# Patient Record
Sex: Female | Born: 1935 | Race: White | Hispanic: No | Marital: Single | Smoking: Never smoker
Health system: Southern US, Community
[De-identification: ages and names within clinical notes are randomized; demographics above are authoritative.]

## PROBLEM LIST (undated history)

## (undated) DIAGNOSIS — I10 Essential (primary) hypertension: Secondary | ICD-10-CM

## (undated) DIAGNOSIS — R569 Unspecified convulsions: Secondary | ICD-10-CM

## (undated) HISTORY — PX: NO PAST SURGERIES: SHX2092

---

## 2005-01-09 ENCOUNTER — Ambulatory Visit: Payer: Self-pay | Admitting: Internal Medicine

## 2005-01-30 ENCOUNTER — Ambulatory Visit: Payer: Self-pay | Admitting: Internal Medicine

## 2005-05-23 ENCOUNTER — Emergency Department: Payer: Self-pay | Admitting: Emergency Medicine

## 2005-05-23 ENCOUNTER — Other Ambulatory Visit: Payer: Self-pay

## 2005-07-24 ENCOUNTER — Encounter: Payer: Self-pay | Admitting: Internal Medicine

## 2005-07-31 ENCOUNTER — Encounter: Payer: Self-pay | Admitting: Internal Medicine

## 2005-09-09 ENCOUNTER — Emergency Department: Payer: Self-pay | Admitting: Emergency Medicine

## 2006-07-19 ENCOUNTER — Ambulatory Visit: Payer: Self-pay | Admitting: Internal Medicine

## 2007-02-15 ENCOUNTER — Emergency Department: Payer: Self-pay | Admitting: Internal Medicine

## 2007-04-29 ENCOUNTER — Ambulatory Visit: Payer: Self-pay | Admitting: Unknown Physician Specialty

## 2008-05-26 ENCOUNTER — Ambulatory Visit: Payer: Self-pay | Admitting: Internal Medicine

## 2008-10-11 ENCOUNTER — Inpatient Hospital Stay: Payer: Self-pay | Admitting: Internal Medicine

## 2010-04-12 ENCOUNTER — Emergency Department: Payer: Self-pay | Admitting: Emergency Medicine

## 2010-11-26 ENCOUNTER — Observation Stay: Payer: Self-pay | Admitting: Internal Medicine

## 2011-01-10 ENCOUNTER — Ambulatory Visit: Payer: Self-pay | Admitting: Neurology

## 2011-01-11 ENCOUNTER — Emergency Department: Payer: Self-pay

## 2011-01-29 ENCOUNTER — Inpatient Hospital Stay: Payer: Self-pay | Admitting: Family Medicine

## 2011-03-21 ENCOUNTER — Ambulatory Visit: Payer: Self-pay | Admitting: Internal Medicine

## 2011-04-22 ENCOUNTER — Emergency Department: Payer: Self-pay | Admitting: Emergency Medicine

## 2012-09-05 ENCOUNTER — Ambulatory Visit: Payer: Self-pay | Admitting: Internal Medicine

## 2016-02-27 ENCOUNTER — Other Ambulatory Visit: Payer: Self-pay | Admitting: Physician Assistant

## 2016-02-27 ENCOUNTER — Ambulatory Visit
Admission: RE | Admit: 2016-02-27 | Discharge: 2016-02-27 | Disposition: A | Discharge status: Home / Self Care | Payer: Medicare Other | Source: Ambulatory Visit | Attending: Physician Assistant | Admitting: Physician Assistant

## 2016-02-27 DIAGNOSIS — M7122 Synovial cyst of popliteal space [Baker], left knee: Secondary | ICD-10-CM | POA: Insufficient documentation

## 2016-02-27 DIAGNOSIS — M7989 Other specified soft tissue disorders: Secondary | ICD-10-CM | POA: Diagnosis present

## 2016-02-27 DIAGNOSIS — M25562 Pain in left knee: Secondary | ICD-10-CM | POA: Insufficient documentation

## 2016-11-19 ENCOUNTER — Emergency Department: Payer: Medicare Other

## 2016-11-19 ENCOUNTER — Emergency Department
Admission: EM | Admit: 2016-11-19 | Discharge: 2016-11-19 | Disposition: A | Discharge status: Home / Self Care | Payer: Medicare Other | Attending: Emergency Medicine | Admitting: Emergency Medicine

## 2016-11-19 DIAGNOSIS — M25552 Pain in left hip: Secondary | ICD-10-CM | POA: Diagnosis not present

## 2016-11-19 DIAGNOSIS — W19XXXA Unspecified fall, initial encounter: Secondary | ICD-10-CM

## 2016-11-19 DIAGNOSIS — I10 Essential (primary) hypertension: Secondary | ICD-10-CM | POA: Insufficient documentation

## 2016-11-19 DIAGNOSIS — Y92009 Unspecified place in unspecified non-institutional (private) residence as the place of occurrence of the external cause: Secondary | ICD-10-CM | POA: Insufficient documentation

## 2016-11-19 DIAGNOSIS — M25512 Pain in left shoulder: Secondary | ICD-10-CM | POA: Diagnosis not present

## 2016-11-19 DIAGNOSIS — Y9301 Activity, walking, marching and hiking: Secondary | ICD-10-CM | POA: Diagnosis not present

## 2016-11-19 DIAGNOSIS — W1809XA Striking against other object with subsequent fall, initial encounter: Secondary | ICD-10-CM | POA: Insufficient documentation

## 2016-11-19 DIAGNOSIS — M25551 Pain in right hip: Secondary | ICD-10-CM | POA: Insufficient documentation

## 2016-11-19 DIAGNOSIS — S79912A Unspecified injury of left hip, initial encounter: Secondary | ICD-10-CM | POA: Diagnosis present

## 2016-11-19 DIAGNOSIS — Y999 Unspecified external cause status: Secondary | ICD-10-CM | POA: Diagnosis not present

## 2016-11-19 HISTORY — DX: Unspecified convulsions: R56.9

## 2016-11-19 HISTORY — DX: Essential (primary) hypertension: I10

## 2016-11-19 NOTE — ED Notes (Signed)
Dr. Pershing ProudSchaevitz informed of BP, pt takes multiple medicines for BP [facility] Dr. Pershing ProudSchaevitz informed pt to take BP medications when she gets home.

## 2016-11-19 NOTE — ED Provider Notes (Signed)
[facility] Emergency Department Provider Note  ____________________________________________   First MD Initiated Contact with Patient 11/19/16 1438     (approximate)  I have reviewed the triage vital signs and the nursing notes.   HISTORY  Chief Complaint Fall   HPI Gina Hampton is a 80 y.o. female with a history of hypertension as well as seizures who is presenting to the emergency department after mechanical fall. She says that she was trying to walk fast with her walker when the walker got ahead of her and she fell forward. She says that she stretched out her left arm and may have hit her left hip as well. She is having pain about the left shoulder which she says is now gone away. However, at home she said it lasted for quite a long time and that this is why she came into the emergency department. She denies any headache. Denies hitting her head or losing consciousness.  Prior to my interview her she isn't to ambulate in the emergency department with minimal assistance. Past Medical History:  Diagnosis Date  . Hypertension   . Seizures (HCC)     There are no active problems to display for this patient.   History reviewed. No pertinent surgical history.  Prior to Admission medications   Not on File    Allergies Patient has no known allergies.  History reviewed. No pertinent family history.  Social History Social History  Substance Use Topics  . Smoking status: Not on file  . Smokeless tobacco: Not on file  . Alcohol use Not on file    Review of Systems Constitutional: No fever/chills Eyes: No visual changes. ENT: No sore throat. Cardiovascular: Denies chest pain. Respiratory: Denies shortness of breath. Gastrointestinal: No abdominal pain.  No nausea, no vomiting.  No diarrhea.  No constipation. Genitourinary: Negative for dysuria. Musculoskeletal: Negative for back pain. Skin: Negative for rash. Neurological: Negative for  headaches, focal weakness or numbness.  10-point ROS otherwise negative.  ____________________________________________   PHYSICAL EXAM:  VITAL SIGNS: ED Triage Vitals [11/19/16 1416]  Enc Vitals Group     BP (!) 159/82     Pulse Rate 60     Resp 16     Temp 97.8 F (36.6 C)     Temp Source Oral     SpO2 98 %     Weight      Height      Head Circumference      Peak Flow      Pain Score 0     Pain Loc      Pain Edu?      Excl. in GC?     Constitutional: Alert and oriented. Well appearing and in no acute distress. Eyes: Conjunctivae are normal. PERRL. EOMI. Head: Atraumatic. Nose: No congestion/rhinnorhea. Mouth/Throat: Mucous membranes are moist.   Neck: No stridor.   Cardiovascular: Normal rate, regular rhythm. Grossly normal heart sounds.   Respiratory: Normal respiratory effort.  No retractions. Lungs CTAB. Gastrointestinal: Soft and nontender. No distention.  Musculoskeletal: No lower extremity tenderness nor edema.  No joint effusions.Pelvis is stable. No tenderness to the bilateral hips. 5 out of 5 strength to bilateral lower extremities. No tenderness or deformity over the left shoulder and she is able to range the left shoulder to full range of motion actively. Neurovascularly intact. Neurologic:  Normal speech and language. No gross focal neurologic deficits are appreciated. Skin:  Skin is warm, dry and intact. No rash noted. Psychiatric:  Mood and affect are normal. Speech and behavior are normal.  ____________________________________________   LABS (all labs ordered are listed, but only abnormal results are displayed)  Labs Reviewed - No data to display ____________________________________________  EKG   ____________________________________________  RADIOLOGY    DG Shoulder Left (Final result)  Result time 11/19/16 16:46:03  Final result by Charlett NoseKevin Dover, MD (11/19/16 16:46:03)           Narrative:   CLINICAL DATA: Fall, left shoulder and hip  pain.  EXAM: LEFT SHOULDER - 2+ VIEW  COMPARISON: None.  FINDINGS: Mild degenerative changes with joint space narrowing and early spurring within the glenohumeral joint. No acute bony abnormality. Specifically, no fracture, subluxation, or dislocation. Soft tissues are intact.  IMPRESSION: No acute bony abnormality.   Electronically Signed By: Charlett NoseKevin Dover M.D. On: 11/19/2016 16:46            DG Hip Unilat W or Wo Pelvis 2-3 Views Left (Final result)  Result time 11/19/16 16:46:55  Final result by David A SwazilandJordan, MD (11/19/16 16:46:55)           Narrative:   CLINICAL DATA: Status post fall with left hip pain.  EXAM: DG HIP (WITH OR WITHOUT PELVIS) 2-3V LEFT  COMPARISON: None in PACs  FINDINGS: The bones are subjectively osteopenic. The pelvis exhibits no lytic or blastic lesion nor acute fracture. AP and lateral views of the left hip reveal mild narrowing of the joint space. The articular surface of the femoral head and acetabulum remains smoothly rounded. The femoral neck, intertrochanteric, and subtrochanteric regions of the left hip appear normal.  There are numerous pelvic phleboliths. There are pelvic arterial calcifications.  IMPRESSION: Mild degenerative change of the left hip. No acute fracture nor dislocation.   Electronically Signed By: David SwazilandJordan M.D. On: 11/19/2016 16:46          ____________________________________________   PROCEDURES  Procedure(s) performed:   Procedures  Critical Care performed:   ____________________________________________   INITIAL IMPRESSION / ASSESSMENT AND PLAN / ED COURSE  Pertinent labs & imaging results that were available during my care of the patient were reviewed by me and considered in my medical decision making (see chart for details).    Clinical Course   ----------------------------------------- 5:24 PM on  11/19/2016 -----------------------------------------  Patient resting ultimately this time. I updated her as to her very reassuring x-ray results. She'll be discharged home. Mechanical fall without any obvious serious internal injury.  ____________________________________________   FINAL CLINICAL IMPRESSION(S) / ED DIAGNOSES  Medical screening exam. Fall.    NEW MEDICATIONS STARTED DURING THIS VISIT:  New Prescriptions   No medications on file     Note:  This document was prepared using Dragon voice recognition software and may include unintentional dictation errors.    Myrna Blazeravid Matthew Schaevitz, MD 11/19/16 1725

## 2016-11-19 NOTE — ED Triage Notes (Signed)
Pt presents [facility] from [facility] after a near fall. PT states she was walking and her walker got too far from her, like it was rolling away from her. Pt states L arm became caught in walker, told EMS L armpit pain and L knee pain. Pt denies pain upon arrival. Pt has hx of falls recently. Denies blurred vision, denies hitting head, back/neck pain, Denies feeling dizzy. Pt had cortisone shot recently in L knee and EMS states she has been falling more often. Pt states she did not fully fall to ground, caught herself on walker. Alert, oriented, moving all extremities, answering all questions.

## 2016-11-19 NOTE — ED Notes (Signed)
Pt ambulated to restroom and back with assist, denies any pain after moving.

## 2016-11-19 NOTE — ED Notes (Signed)
E-signature pad not working. Pt verbalized DC

## 2016-11-19 NOTE — ED Notes (Signed)
Pt ambulated to restroom and back to stretcher. Gina SkeansLou Ann, pt relations gave pt something to drink and snack on.

## 2017-01-01 ENCOUNTER — Emergency Department
Admission: EM | Admit: 2017-01-01 | Discharge: 2017-01-01 | Disposition: A | Discharge status: Home / Self Care | Payer: Medicare Other | Attending: Emergency Medicine | Admitting: Emergency Medicine

## 2017-01-01 ENCOUNTER — Encounter: Payer: Self-pay | Admitting: Emergency Medicine

## 2017-01-01 ENCOUNTER — Emergency Department: Payer: Medicare Other

## 2017-01-01 DIAGNOSIS — Y999 Unspecified external cause status: Secondary | ICD-10-CM | POA: Insufficient documentation

## 2017-01-01 DIAGNOSIS — W19XXXA Unspecified fall, initial encounter: Secondary | ICD-10-CM | POA: Insufficient documentation

## 2017-01-01 DIAGNOSIS — I10 Essential (primary) hypertension: Secondary | ICD-10-CM | POA: Diagnosis not present

## 2017-01-01 DIAGNOSIS — I6782 Cerebral ischemia: Secondary | ICD-10-CM | POA: Insufficient documentation

## 2017-01-01 DIAGNOSIS — Y92009 Unspecified place in unspecified non-institutional (private) residence as the place of occurrence of the external cause: Secondary | ICD-10-CM | POA: Insufficient documentation

## 2017-01-01 DIAGNOSIS — Z043 Encounter for examination and observation following other accident: Secondary | ICD-10-CM | POA: Insufficient documentation

## 2017-01-01 DIAGNOSIS — Y939 Activity, unspecified: Secondary | ICD-10-CM | POA: Diagnosis not present

## 2017-01-01 LAB — CBC WITH DIFFERENTIAL/PLATELET
Basophils Absolute: 0 10*3/uL (ref 0–0.1)
Basophils Relative: 1 %
EOS PCT: 3 %
Eosinophils Absolute: 0.1 10*3/uL (ref 0–0.7)
HCT: 37.9 % (ref 35.0–47.0)
Hemoglobin: 13 g/dL (ref 12.0–16.0)
LYMPHS PCT: 32 %
Lymphs Abs: 1.6 10*3/uL (ref 1.0–3.6)
MCH: 29.7 pg (ref 26.0–34.0)
MCHC: 34.2 g/dL (ref 32.0–36.0)
MCV: 86.7 fL (ref 80.0–100.0)
MONO ABS: 0.4 10*3/uL (ref 0.2–0.9)
Monocytes Relative: 8 %
Neutro Abs: 2.9 10*3/uL (ref 1.4–6.5)
Neutrophils Relative %: 56 %
Platelets: 192 10*3/uL (ref 150–440)
RBC: 4.37 MIL/uL (ref 3.80–5.20)
RDW: 13.2 % (ref 11.5–14.5)
WBC: 5.1 10*3/uL (ref 3.6–11.0)

## 2017-01-01 LAB — BASIC METABOLIC PANEL
ANION GAP: 5 (ref 5–15)
BUN: 18 mg/dL (ref 6–20)
CALCIUM: 8.9 mg/dL (ref 8.9–10.3)
CO2: 33 mmol/L — ABNORMAL HIGH (ref 22–32)
CREATININE: 0.85 mg/dL (ref 0.44–1.00)
Chloride: 98 mmol/L — ABNORMAL LOW (ref 101–111)
GFR calc Af Amer: >60 mL/min (ref >60)
GFR calc non Af Amer: >60 mL/min (ref >60)
GLUCOSE: 110 mg/dL — AB (ref 65–99)
Potassium: 4 mmol/L (ref 3.5–5.1)
Sodium: 136 mmol/L (ref 135–145)

## 2017-01-01 LAB — URINALYSIS, COMPLETE (UACMP) WITH MICROSCOPIC
Bacteria, UA: NONE SEEN
Bilirubin Urine: NEGATIVE
Glucose, UA: NEGATIVE mg/dL
Hgb urine dipstick: NEGATIVE
Ketones, ur: NEGATIVE mg/dL
Leukocytes, UA: NEGATIVE
Nitrite: NEGATIVE
PH: 8 (ref 5.0–8.0)
Protein, ur: NEGATIVE mg/dL
SPECIFIC GRAVITY, URINE: 1.01 (ref 1.005–1.030)

## 2017-01-01 LAB — TROPONIN I: Troponin I: <0.03 ng/mL (ref <0.03)

## 2017-01-01 NOTE — ED Notes (Signed)
Pt assisted to use bathroom at this time.

## 2017-01-01 NOTE — ED Notes (Signed)
[facility] and gave report to Molson Coors BrewingMelanie

## 2017-01-01 NOTE — ED Notes (Signed)
Pt up to bedside commode at this time with one assist

## 2017-01-01 NOTE — ED Provider Notes (Signed)
[facility] Emergency Department Provider Note  ____________________________________________  Time seen: Approximately 6:45 PM  I have reviewed the triage vital signs and the nursing notes.   HISTORY  Chief Complaint Fall  Level 5 caveat:  Portions of the history and physical were unable to be obtained due to dementia   HPI Gina Hampton is a 81 y.o. female history of hypertension and seizure multiple falls who presents for evaluation after a fall. According to EMS patient had a [facility] [place] with no head trauma or LOC. Patient has no recollection of falling. She has no complaints at this time and denies headache, neck pain, back pain, chest pain, shortness of breath, abdominal pain, or pain in her extremities.  Past Medical History:  Diagnosis Date  . Hypertension   . Seizures (HCC)     There are no active problems to display for this patient.   History reviewed. No pertinent surgical history.  Prior to Admission medications   Not on File    Allergies Patient has no known allergies.  History reviewed. No pertinent family history.  Social History Social History  Substance Use Topics  . Smoking status: Never Smoker  . Smokeless tobacco: Never Used  . Alcohol use No    Review of Systems Constitutional: Negative for fever. Eyes: Negative for visual changes. ENT: Negative for facial injury or neck injury Cardiovascular: Negative for chest injury. Respiratory: Negative for shortness of breath. Negative for chest wall injury. Gastrointestinal: Negative for abdominal pain or injury. Genitourinary: Negative for dysuria. Musculoskeletal: Negative for back injury, negative for arm or leg pain. Skin: Negative for laceration/abrasions. Neurological: Negative for head injury.  ____________________________________________   PHYSICAL EXAM:  VITAL SIGNS: ED Triage Vitals [01/01/17 1753]  Enc Vitals Group   BP (!) 205/108     Pulse Rate 67     Resp 16     Temp 97.8 F (36.6 C)     Temp Source Oral     SpO2 99 %     Weight 139 lb (63 kg)     Height 5\' 2"  (1.575 m)     Head Circumference      Peak Flow      Pain Score 0     Pain Loc      Pain Edu?      Excl. in GC?     Constitutional: Alert and oriented. No acute distress. Does not appear intoxicated. HEENT Head: Normocephalic and atraumatic. Face: No facial bony tenderness. Stable midface Ears: No hemotympanum bilaterally. No Battle sign Eyes: No eye injury. PERRL. No raccoon eyes Nose: Nontender. No epistaxis. No rhinorrhea Mouth/Throat: Mucous membranes are moist. No oropharyngeal blood. No dental injury. Airway patent without stridor. Normal voice. Neck: no C-collar in place. No midline c-spine tenderness.  Cardiovascular: Normal rate, regular rhythm. Normal and symmetric distal pulses are present in all extremities. Pulmonary/Chest: Chest wall is stable and nontender to palpation/compression. Normal respiratory effort. Breath sounds are normal. No crepitus.  Abdominal: Soft, nontender, non distended. Musculoskeletal: Nontender with normal full range of motion in all extremities. No deformities. No thoracic or lumbar midline spinal tenderness. Pelvis is stable. Skin: Skin is warm, dry and intact. No abrasions or contutions. Psychiatric: Speech and behavior are appropriate. Neurological: Normal speech and language. Moves all extremities to command. No gross focal neurologic deficits are appreciated.  Glascow Coma Score: 4 - Opens eyes on own 6 - Follows simple motor commands 5 - Alert and  oriented GCS: 15   ____________________________________________   LABS (all labs ordered are listed, but only abnormal results are displayed)  Labs Reviewed  BASIC METABOLIC PANEL - Abnormal; Notable for the following:       Result Value   Chloride 98 (*)    CO2 33 (*)    Glucose, Bld 110 (*)    All other components within normal  limits  URINALYSIS, COMPLETE (UACMP) WITH MICROSCOPIC - Abnormal; Notable for the following:    Color, Urine STRAW (*)    APPearance CLEAR (*)    Squamous Epithelial / LPF 0-5 (*)    All other components within normal limits  CBC WITH DIFFERENTIAL/PLATELET  TROPONIN I   ____________________________________________  EKG  ED ECG REPORT I, Nita Sickle, the attending physician, personally viewed and interpreted this ECG.  Normal sinus rhythm, rate of 62, normal intervals, normal axis, no ST elevations or depressions. T-wave inversion in lead 3 and aVF. Unchanged from prior ____________________________________________  RADIOLOGY  Head CT: negative  ____________________________________________   PROCEDURES  Procedure(s) performed: None Procedures Critical Care performed:  None ____________________________________________   INITIAL IMPRESSION / ASSESSMENT AND PLAN / ED COURSE  81 y.o. female history of hypertension and seizure multiple falls who presents for evaluation after a fall. Patient is no complaints at this time however due to history of dementia we'll check basic lab work, head CT, and EKG.  Clinical Course as of Jan 01 1934  Tue Jan 01, 2017  1933 CT head and labs in no acute finding. Patient remains stable with no complaints. We'll discharge back home to her facility.  [CV]    Clinical Course User Index [CV] Nita Sickle, MD    Pertinent labs & imaging results that were available during my care of the patient were reviewed by me and considered in my medical decision making (see chart for details).    ____________________________________________   FINAL CLINICAL IMPRESSION(S) / ED DIAGNOSES  Final diagnoses:  Fall, initial encounter      NEW MEDICATIONS STARTED DURING THIS VISIT:  New Prescriptions   No medications on file     Note:  This document was prepared using Dragon voice recognition software and may include unintentional  dictation errors.    Nita Sickle, MD 01/01/17 Barry Brunner

## 2017-01-01 NOTE — ED Triage Notes (Signed)
[facility] from [facility] [place] for witnessed fall. Pt denies pain and denies LOC.

## 2017-01-01 NOTE — ED Notes (Signed)
Pt assisted to bedside commode at this time

## 2017-01-01 NOTE — Discharge Instructions (Signed)

## 2017-01-23 ENCOUNTER — Inpatient Hospital Stay
Admission: EM | Admit: 2017-01-23 | Discharge: 2017-01-25 | DRG: 193 | Disposition: A | Discharge status: Skilled Nursing Facility | Payer: Medicare Other | Attending: Internal Medicine | Admitting: Internal Medicine

## 2017-01-23 ENCOUNTER — Emergency Department: Payer: Medicare Other

## 2017-01-23 DIAGNOSIS — N179 Acute kidney failure, unspecified: Secondary | ICD-10-CM | POA: Diagnosis present

## 2017-01-23 DIAGNOSIS — Z79899 Other long term (current) drug therapy: Secondary | ICD-10-CM | POA: Diagnosis not present

## 2017-01-23 DIAGNOSIS — J111 Influenza due to unidentified influenza virus with other respiratory manifestations: Secondary | ICD-10-CM

## 2017-01-23 DIAGNOSIS — R2681 Unsteadiness on feet: Secondary | ICD-10-CM

## 2017-01-23 DIAGNOSIS — J9601 Acute respiratory failure with hypoxia: Secondary | ICD-10-CM | POA: Diagnosis present

## 2017-01-23 DIAGNOSIS — J209 Acute bronchitis, unspecified: Secondary | ICD-10-CM | POA: Diagnosis present

## 2017-01-23 DIAGNOSIS — Z79891 Long term (current) use of opiate analgesic: Secondary | ICD-10-CM | POA: Diagnosis not present

## 2017-01-23 DIAGNOSIS — I959 Hypotension, unspecified: Secondary | ICD-10-CM

## 2017-01-23 DIAGNOSIS — R569 Unspecified convulsions: Secondary | ICD-10-CM | POA: Diagnosis present

## 2017-01-23 DIAGNOSIS — J101 Influenza due to other identified influenza virus with other respiratory manifestations: Secondary | ICD-10-CM | POA: Diagnosis not present

## 2017-01-23 DIAGNOSIS — R410 Disorientation, unspecified: Secondary | ICD-10-CM

## 2017-01-23 DIAGNOSIS — E860 Dehydration: Secondary | ICD-10-CM | POA: Diagnosis present

## 2017-01-23 DIAGNOSIS — Z9181 History of falling: Secondary | ICD-10-CM

## 2017-01-23 DIAGNOSIS — Z66 Do not resuscitate: Secondary | ICD-10-CM | POA: Diagnosis present

## 2017-01-23 DIAGNOSIS — D696 Thrombocytopenia, unspecified: Secondary | ICD-10-CM | POA: Diagnosis present

## 2017-01-23 DIAGNOSIS — I10 Essential (primary) hypertension: Secondary | ICD-10-CM | POA: Diagnosis present

## 2017-01-23 DIAGNOSIS — R296 Repeated falls: Secondary | ICD-10-CM

## 2017-01-23 DIAGNOSIS — K219 Gastro-esophageal reflux disease without esophagitis: Secondary | ICD-10-CM | POA: Diagnosis present

## 2017-01-23 LAB — URINALYSIS, COMPLETE (UACMP) WITH MICROSCOPIC
BILIRUBIN URINE: NEGATIVE
Bacteria, UA: NONE SEEN
GLUCOSE, UA: NEGATIVE mg/dL
HGB URINE DIPSTICK: NEGATIVE
Ketones, ur: 5 mg/dL — AB
Leukocytes, UA: NEGATIVE
Nitrite: NEGATIVE
PROTEIN: 30 mg/dL — AB
Specific Gravity, Urine: 1.028 (ref 1.005–1.030)
pH: 5 (ref 5.0–8.0)

## 2017-01-23 LAB — GASTROINTESTINAL PANEL BY PCR, STOOL (REPLACES STOOL CULTURE)
Adenovirus F40/41: NOT DETECTED
Astrovirus: NOT DETECTED
CAMPYLOBACTER SPECIES: NOT DETECTED
CYCLOSPORA CAYETANENSIS: NOT DETECTED
Cryptosporidium: NOT DETECTED
ENTAMOEBA HISTOLYTICA: NOT DETECTED
Enteroaggregative E coli (EAEC): NOT DETECTED
Enteropathogenic E coli (EPEC): NOT DETECTED
Enterotoxigenic E coli (ETEC): NOT DETECTED
Giardia lamblia: NOT DETECTED
Norovirus GI/GII: NOT DETECTED
PLESIMONAS SHIGELLOIDES: NOT DETECTED
Rotavirus A: NOT DETECTED
SALMONELLA SPECIES: NOT DETECTED
SAPOVIRUS (I, II, IV, AND V): NOT DETECTED
SHIGA LIKE TOXIN PRODUCING E COLI (STEC): NOT DETECTED
SHIGELLA/ENTEROINVASIVE E COLI (EIEC): NOT DETECTED
VIBRIO SPECIES: NOT DETECTED
Vibrio cholerae: NOT DETECTED
Yersinia enterocolitica: NOT DETECTED

## 2017-01-23 LAB — BASIC METABOLIC PANEL
ANION GAP: 8 (ref 5–15)
BUN: 26 mg/dL — ABNORMAL HIGH (ref 6–20)
CALCIUM: 8.4 mg/dL — AB (ref 8.9–10.3)
CO2: 30 mmol/L (ref 22–32)
Chloride: 99 mmol/L — ABNORMAL LOW (ref 101–111)
Creatinine, Ser: 1.31 mg/dL — ABNORMAL HIGH (ref 0.44–1.00)
GFR, EST AFRICAN AMERICAN: 44 mL/min — AB (ref >60)
GFR, EST NON AFRICAN AMERICAN: 38 mL/min — AB (ref >60)
Glucose, Bld: 122 mg/dL — ABNORMAL HIGH (ref 65–99)
POTASSIUM: 3.6 mmol/L (ref 3.5–5.1)
SODIUM: 137 mmol/L (ref 135–145)

## 2017-01-23 LAB — INFLUENZA PANEL BY PCR (TYPE A & B)
INFLBPCR: NEGATIVE
Influenza A By PCR: POSITIVE — AB

## 2017-01-23 LAB — CBC
HCT: 38.3 % (ref 35.0–47.0)
HEMOGLOBIN: 12.9 g/dL (ref 12.0–16.0)
MCH: 29.4 pg (ref 26.0–34.0)
MCHC: 33.7 g/dL (ref 32.0–36.0)
MCV: 87.2 fL (ref 80.0–100.0)
Platelets: 145 10*3/uL — ABNORMAL LOW (ref 150–440)
RBC: 4.39 MIL/uL (ref 3.80–5.20)
RDW: 13.1 % (ref 11.5–14.5)
WBC: 5.5 10*3/uL (ref 3.6–11.0)

## 2017-01-23 LAB — LACTIC ACID, PLASMA
Lactic Acid, Venous: 1.4 mmol/L (ref 0.5–1.9)
Lactic Acid, Venous: 1.6 mmol/L (ref 0.5–1.9)

## 2017-01-23 MED ORDER — CARBAMAZEPINE 200 MG PO TABS
200.0000 mg | ORAL_TABLET | Freq: Two times a day (BID) | ORAL | Status: DC
  Administered 2017-01-23 – 2017-01-25 (×4): 200 mg via ORAL
  Filled 2017-01-23 (×4): qty 1

## 2017-01-23 MED ORDER — LORATADINE 10 MG PO TABS
10.0000 mg | ORAL_TABLET | Freq: Every day | ORAL | Status: DC
  Administered 2017-01-24 – 2017-01-25 (×2): 10 mg via ORAL
  Filled 2017-01-23 (×2): qty 1

## 2017-01-23 MED ORDER — GABAPENTIN 600 MG PO TABS
600.0000 mg | ORAL_TABLET | Freq: Every day | ORAL | Status: DC
  Administered 2017-01-23 – 2017-01-24 (×2): 600 mg via ORAL
  Filled 2017-01-23 (×2): qty 1

## 2017-01-23 MED ORDER — OSELTAMIVIR PHOSPHATE 30 MG PO CAPS
30.0000 mg | ORAL_CAPSULE | Freq: Two times a day (BID) | ORAL | Status: DC
  Administered 2017-01-23 – 2017-01-25 (×4): 30 mg via ORAL
  Filled 2017-01-23 (×4): qty 1

## 2017-01-23 MED ORDER — METHYLPREDNISOLONE SODIUM SUCC 40 MG IJ SOLR
40.0000 mg | INTRAMUSCULAR | Status: AC
  Administered 2017-01-23: 40 mg via INTRAVENOUS
  Filled 2017-01-23: qty 1

## 2017-01-23 MED ORDER — ACETAMINOPHEN 325 MG PO TABS: 650.0000 mg | ORAL_TABLET | Freq: Four times a day (QID) | ORAL | Status: DC | PRN

## 2017-01-23 MED ORDER — METHYLPREDNISOLONE SODIUM SUCC 40 MG IJ SOLR
40.0000 mg | Freq: Every day | INTRAMUSCULAR | Status: DC
  Administered 2017-01-24: 40 mg via INTRAVENOUS
  Filled 2017-01-23: qty 1

## 2017-01-23 MED ORDER — DONEPEZIL HCL 5 MG PO TABS
10.0000 mg | ORAL_TABLET | Freq: Every day | ORAL | Status: DC
  Administered 2017-01-23 – 2017-01-24 (×2): 10 mg via ORAL
  Filled 2017-01-23 (×2): qty 2

## 2017-01-23 MED ORDER — PANTOPRAZOLE SODIUM 20 MG PO TBEC
20.0000 mg | DELAYED_RELEASE_TABLET | Freq: Every day | ORAL | Status: DC
  Filled 2017-01-23: qty 1

## 2017-01-23 MED ORDER — CITALOPRAM HYDROBROMIDE 20 MG PO TABS
20.0000 mg | ORAL_TABLET | Freq: Every day | ORAL | Status: DC
  Administered 2017-01-24 – 2017-01-25 (×2): 20 mg via ORAL
  Filled 2017-01-23 (×2): qty 1

## 2017-01-23 MED ORDER — SODIUM CHLORIDE 0.9 % IV SOLN
INTRAVENOUS | Status: DC
  Administered 2017-01-23: 17:00:00 via INTRAVENOUS

## 2017-01-23 MED ORDER — AZELASTINE HCL 0.1 % NA SOLN
1.0000 | Freq: Two times a day (BID) | NASAL | Status: DC
  Administered 2017-01-23 – 2017-01-24 (×3): 1 via NASAL
  Filled 2017-01-23: qty 30

## 2017-01-23 MED ORDER — SODIUM CHLORIDE 0.9 % IV BOLUS (SEPSIS)
1000.0000 mL | Freq: Once | INTRAVENOUS | Status: AC
  Administered 2017-01-23: 1000 mL via INTRAVENOUS

## 2017-01-23 MED ORDER — BUDESONIDE 0.25 MG/2ML IN SUSP
0.2500 mg | Freq: Two times a day (BID) | RESPIRATORY_TRACT | Status: DC
  Administered 2017-01-23 – 2017-01-25 (×4): 0.25 mg via RESPIRATORY_TRACT
  Filled 2017-01-23 (×4): qty 2

## 2017-01-23 MED ORDER — LEVETIRACETAM 500 MG PO TABS
500.0000 mg | ORAL_TABLET | Freq: Two times a day (BID) | ORAL | Status: DC
  Administered 2017-01-23 – 2017-01-25 (×4): 500 mg via ORAL
  Filled 2017-01-23 (×4): qty 1

## 2017-01-23 MED ORDER — IPRATROPIUM-ALBUTEROL 0.5-2.5 (3) MG/3ML IN SOLN
3.0000 mL | Freq: Four times a day (QID) | RESPIRATORY_TRACT | Status: DC
  Administered 2017-01-23 – 2017-01-24 (×5): 3 mL via RESPIRATORY_TRACT
  Filled 2017-01-23 (×5): qty 3

## 2017-01-23 MED ORDER — ACETAMINOPHEN 650 MG RE SUPP: 650.0000 mg | Freq: Four times a day (QID) | RECTAL | Status: DC | PRN

## 2017-01-23 MED ORDER — OSELTAMIVIR PHOSPHATE 30 MG PO CAPS
30.0000 mg | ORAL_CAPSULE | Freq: Once | ORAL | Status: AC
Start: 2017-01-23 — End: 2017-01-23
  Administered 2017-01-23: 30 mg via ORAL
  Filled 2017-01-23 (×2): qty 1

## 2017-01-23 MED ORDER — ENOXAPARIN SODIUM 40 MG/0.4ML ~~LOC~~ SOLN
40.0000 mg | SUBCUTANEOUS | Status: DC
  Administered 2017-01-23 – 2017-01-24 (×2): 40 mg via SUBCUTANEOUS
  Filled 2017-01-23 (×2): qty 0.4

## 2017-01-23 MED ORDER — GABAPENTIN 300 MG PO CAPS
300.0000 mg | ORAL_CAPSULE | Freq: Every morning | ORAL | Status: DC
  Administered 2017-01-24 – 2017-01-25 (×2): 300 mg via ORAL
  Filled 2017-01-23 (×2): qty 1

## 2017-01-23 NOTE — ED Triage Notes (Signed)
[facility] w/ c/o fever, cough and "feeling sick" x 2-3 days.  Pt normally ambulatory but EMS reports that pt was unable to stand for them.  Pt alert and oriented to self and situation.

## 2017-01-23 NOTE — ED Provider Notes (Signed)
[facility] Emergency Department Provider Note   ____________________________________________   First MD Initiated Contact with Patient 01/23/17 1433     (approximate)  I have reviewed the triage vital signs and the nursing notes.   HISTORY  Chief Complaint Influenza  HPI Gina Hampton is a 81 y.o. female here for evaluation of weakness, cough, and fevers  EM caveat: The patient is somewhat unreliable in her history, difficulty recalling all events  The patient does report feeling short of breath, having a cough, congested, and fever over the last several days. She reports feeling very weak, and was difficulty walking the last few days because she feels so very tired  She does deny having abdominal pain, she denies any frequent diarrhea, or chest pain.   Past Medical History:  Diagnosis Date  . Hypertension   . Seizures (HCC)     There are no active problems to display for this patient.   History reviewed. No pertinent surgical history.  Prior to Admission medications   Medication Sig Start Date End Date Taking? Authorizing Provider  acetaminophen (TYLENOL) 325 MG tablet Take 650 mg by mouth every 6 (six) hours as needed.   Yes Historical Provider, MD  atorvastatin (LIPITOR) 20 MG tablet Take 20 mg by mouth daily.   Yes Historical Provider, MD  azelastine (ASTELIN) 0.1 % nasal spray Place 1 spray into both nostrils 2 (two) times daily. Use in each nostril as directed   Yes Historical Provider, MD  carbamazepine (TEGRETOL) 200 MG tablet Take 200 mg by mouth 2 (two) times daily.   Yes Historical Provider, MD  citalopram (CELEXA) 20 MG tablet Take 20 mg by mouth daily.   Yes Historical Provider, MD  donepezil (ARICEPT) 10 MG tablet Take 10 mg by mouth at bedtime.   Yes Historical Provider, MD  fexofenadine (ALLEGRA) 180 MG tablet Take 180 mg by mouth daily.   Yes Historical Provider, MD  gabapentin (NEURONTIN) 300 MG capsule Take 300-600 mg by  mouth 2 (two) times daily. 300mg  in the morning 600mg  at bedtime   Yes Historical Provider, MD  levETIRAcetam (KEPPRA) 500 MG tablet Take 500 mg by mouth 2 (two) times daily.   Yes Historical Provider, MD  lisinopril (PRINIVIL,ZESTRIL) 20 MG tablet Take 20 mg by mouth daily.   Yes Historical Provider, MD  metoprolol tartrate (LOPRESSOR) 25 MG tablet Take 25 mg by mouth 2 (two) times daily. Check pulse hold if <60   Yes Historical Provider, MD  oseltamivir (TAMIFLU) 75 MG capsule Take 75 mg by mouth 2 (two) times daily. 01/23/17 01/27/17 Yes Historical Provider, MD  pantoprazole (PROTONIX) 20 MG tablet Take 20 mg by mouth daily.   Yes Historical Provider, MD    Allergies Patient has no known allergies.  No family history on file.  Social History Social History  Substance Use Topics  . Smoking status: Never Smoker  . Smokeless tobacco: Never Used  . Alcohol use No    Review of Systems Constitutional: Fatigue fevers and chills Eyes: No visual changes. ENT: No sore throat. Reports cough and scratchy throat Cardiovascular: Denies chest pain. Respiratory: Denies shortness of breath. Reports cough Gastrointestinal: No abdominal pain.  No nausea, no vomiting.  No diarrhea.  Reports she ate a normal meal before she ended up calling around 1 Genitourinary: Negative for dysuria. Musculoskeletal: Negative for back pain. Skin: Negative for rash. Neurological: Negative for headaches, focal weakness or numbness.  10-point ROS otherwise negative.  ____________________________________________   PHYSICAL  EXAM:  VITAL SIGNS: ED Triage Vitals [01/23/17 1352]  Enc Vitals Group     BP (!) 99/57     Pulse Rate 67     Resp 20     Temp 98 F (36.7 C)     Temp Source Oral     SpO2 91 %     Weight 140 lb (63.5 kg)     Height 5\' 2"  (1.575 m)     Head Circumference      Peak Flow      Pain Score 0     Pain Loc      Pain Edu?      Excl. in GC?     Constitutional: Alert and slightly  confused, generally weak tired.  Eyes: Conjunctivae are normal. PERRL. E Head: Atraumatic. Nose: No congestion/rhinnorhea. Mouth/Throat: Mucous membranes are moist.  Oropharynx non-erythematous. Neck: No stridor.  Cardiovascular: Normal rate, regular rhythm. Grossly normal heart sounds.  Good peripheral circulation. Respiratory: Normal respiratory effort.  No retractions. Lungs CTAB. Frequent cough. Gastrointestinal: Soft and nontender. No distention.  Musculoskeletal: No lower extremity tenderness nor edema.  Neurologic:  Patient is alert, but slightly disoriented. Moves all extremities. Skin:  Skin is warm, dry and intact. No rash noted. Psychiatric: Tired, but in no acute distress.  ____________________________________________   LABS (all labs ordered are listed, but only abnormal results are displayed)  Labs Reviewed  BASIC METABOLIC PANEL - Abnormal; Notable for the following:       Result Value   Chloride 99 (*)    Glucose, Bld 122 (*)    BUN 26 (*)    Creatinine, Ser 1.31 (*)    Calcium 8.4 (*)    GFR calc non Af Amer 38 (*)    GFR calc Af Amer 44 (*)    All other components within normal limits  CBC - Abnormal; Notable for the following:    Platelets 145 (*)    All other components within normal limits  URINALYSIS, COMPLETE (UACMP) WITH MICROSCOPIC - Abnormal; Notable for the following:    Color, Urine AMBER (*)    APPearance HAZY (*)    Ketones, ur 5 (*)    Protein, ur 30 (*)    Squamous Epithelial / LPF 0-5 (*)    All other components within normal limits  INFLUENZA PANEL BY PCR (TYPE A & B) - Abnormal; Notable for the following:    Influenza A By PCR POSITIVE (*)    All other components within normal limits  CULTURE, BLOOD (ROUTINE X 2)  CULTURE, BLOOD (ROUTINE X 2)  LACTIC ACID, PLASMA  LACTIC ACID, PLASMA  CBG MONITORING, ED   ____________________________________________  EKG  Reviewed and interpreted by me at 1405 Ventricular rate 60 QRS 80 QTc  4:30 Normal sinus rhythm, minimal ST elevation noted in lead 1 only, likely related to left ventricular hypertrophy without associated other ischemic changes noted ____________________________________________  RADIOLOGY  Dg Chest Portable 1 View  Result Date: 01/23/2017 CLINICAL DATA:  Fever and cough for 2-3 days EXAM: PORTABLE CHEST 1 VIEW COMPARISON:  Portable chest x-ray of 11/26/2010 FINDINGS: No active infiltrate or effusion is seen. Mediastinal and hilar contours are unremarkable. The heart is borderline enlarged and stable. No acute bony abnormality is seen. IMPRESSION: No active disease. Electronically Signed   By: Dwyane Dee M.D.   On: 01/23/2017 15:50    ____________________________________________   PROCEDURES  Procedure(s) performed: None  Procedures  Critical Care performed: No  ____________________________________________   INITIAL  IMPRESSION / ASSESSMENT AND PLAN / ED COURSE  Pertinent labs & imaging results that were available during my care of the patient were reviewed by me and considered in my medical decision making (see chart for details).   ----------------------------------------- 2:51 PM on 01/23/2017 ----------------------------------------- Called and left message with Gina Hampton, patient's listed emergency contact. Did not leave patient identifying information, but did leave a return call number.  Patient noted to have hypotension, fatigue, and the patient self reports fever and generalized weakness and cough at home. Concerning for possible sepsis, though a broad differential is also considered. Blood cultures, lactate, and influenza/chest x-ray pending at this time. IV fluid hydration including fluid bolus ordered and initiated at this time.  Clinical Course as of Jan 24 1600  Wed Jan 23, 2017  1506 Talked with patient's sister who reports she is her HCPOA. Sister reports poor oral intake, but hasn't spoken to her for a few days. Patient's  sister reports she is health care power of attorney and reports the patient does have a DO NOT RESUSCITATE.  Do not intubate. No "heroic" measures or central line, but ok with IV fluids, antibiotics, and hospital admission.  [MQ]    Clinical Course User Index [MQ] Sharyn Creamer, MD   Patient's sister is Gina Hampton.  ----------------------------------------- 3:50 PM on 01/23/2017 -----------------------------------------  Patient influenza positive. We'll treat with Tamiflu. Will admit patient due to associated hypotension alteration in mental status. Updated patient's sister who is agreeable with plan. Patient appears improving, blood pressure improved, appears stable for admission.  Vitals:   01/23/17 1454 01/23/17 1500  BP: 104/75 108/61  Pulse: 63 62  Resp:    Temp:      ____________________________________________   FINAL CLINICAL IMPRESSION(S) / ED DIAGNOSES  Final diagnoses:  Influenza  Hypotension, unspecified hypotension type  Delirium      NEW MEDICATIONS STARTED DURING THIS VISIT:  New Prescriptions   No medications on file     Note:  This document was prepared using Dragon voice recognition software and may include unintentional dictation errors.     Sharyn Creamer, MD 01/23/17 [phone]

## 2017-01-23 NOTE — Progress Notes (Signed)
Pt arrived via stretcher from the Er at approx 1700. Pt was transferred from stretcher to bed by staff, pt arrived incontinent of stool and urine in brief, was cleaned up by staff. Pt is alert and oriented to self and place, states she was sent to the hospital because she couldn't walk and asked ot have her hair washed. [facility] at [facility], pt has some audible wheezing but sat is 100%. Lungs with some wheezing inspiratory and expiratory noted, but respirations are overall unlabored. HR is regular, abdomen is soft, bs heard. Ppp, no edema noted. Pt c/o pain to her R leg when rolled over in the bed for cleaning, pt stated that leg hurts her much of the time, skin is intact with no bruising, abrasions, etc. Pt denied falling. Pt noted to have ring to r hand and watch to r wrist. PIV #20 intact to R fa, iv #22 intact to l fa and ns hung at 2240mls/hr per order. Pt only able to answer basic questions, is vague to history. Pt placed on droplet precautions and on enteric precautions due to loose stool. Pt stated she started to get sick 3 days ago, but that diarrhea didn't start until she got to the Er. Pt resting in bed and awaiting dinner at this time.

## 2017-01-23 NOTE — H&P (Signed)
[facility] [place] [facility]   PATIENT NAME: Gina Hampton    MR#:  [id number]  DATE OF BIRTH:  [birth year]  DATE OF ADMISSION:  01/23/2017  PRIMARY CARE PHYSICIAN: Lynnea Ferrier, MD   REQUESTING/REFERRING PHYSICIAN: Dr Sharyn Creamer  CHIEF COMPLAINT:   Chief Complaint  Patient presents with  . Influenza    HISTORY OF PRESENT ILLNESS:  Gina Hampton  is a 81 y.o. female sent in from facility for influenza and difficulty breathing and weakness and couldn't walk to the bathroom. The patient is not the best historian but answers some yes or no questions. She complains of some cough and weakness. She was found to be hypotensive in the ER but responded to fluid bolus. She was found to have the influenza A positive. Hospitalist services were contacted for further evaluation  PAST MEDICAL HISTORY:   Past Medical History:  Diagnosis Date  . Hypertension   . Seizures (HCC)     PAST SURGICAL HISTORY:   Past Surgical History:  Procedure Laterality Date  . NO PAST SURGERIES      SOCIAL HISTORY:   Social History  Substance Use Topics  . Smoking status: Never Smoker  . Smokeless tobacco: Never Used  . Alcohol use No    FAMILY HISTORY:   Family History  Problem Relation Age of Onset  . Cancer Mother     DRUG ALLERGIES:  No Known Allergies  REVIEW OF SYSTEMS:  CONSTITUTIONAL: Positive for fever, chills and sweats. Positive for weakness.  EYES: No blurred or double vision.  EARS, NOSE, AND THROAT: No tinnitus or ear pain. No sore throat RESPIRATORY: Positive for cough and shortness of breath, no hemoptysis.  CARDIOVASCULAR: No chest pain, orthopnea, edema.  GASTROINTESTINAL: No nausea, vomiting, diarrhea or abdominal pain. No blood in bowel movements GENITOURINARY: No dysuria, hematuria.  ENDOCRINE: No polyuria, nocturia,  HEMATOLOGY: No anemia, easy bruising or bleeding SKIN: No rash or lesion. MUSCULOSKELETAL: No joint pain or arthritis.    NEUROLOGIC: No tingling, numbness, weakness.  PSYCHIATRY: No anxiety or depression.   MEDICATIONS AT HOME:   Prior to Admission medications   Medication Sig Start Date End Date Taking? Authorizing Provider  acetaminophen (TYLENOL) 325 MG tablet Take 650 mg by mouth every 6 (six) hours as needed.   Yes Historical Provider, MD  atorvastatin (LIPITOR) 20 MG tablet Take 20 mg by mouth daily.   Yes Historical Provider, MD  azelastine (ASTELIN) 0.1 % nasal spray Place 1 spray into both nostrils 2 (two) times daily. Use in each nostril as directed   Yes Historical Provider, MD  carbamazepine (TEGRETOL) 200 MG tablet Take 200 mg by mouth 2 (two) times daily.   Yes Historical Provider, MD  citalopram (CELEXA) 20 MG tablet Take 20 mg by mouth daily.   Yes Historical Provider, MD  donepezil (ARICEPT) 10 MG tablet Take 10 mg by mouth at bedtime.   Yes Historical Provider, MD  fexofenadine (ALLEGRA) 180 MG tablet Take 180 mg by mouth daily.   Yes Historical Provider, MD  gabapentin (NEURONTIN) 300 MG capsule Take 300-600 mg by mouth 2 (two) times daily. 300mg  in the morning 600mg  at bedtime   Yes Historical Provider, MD  levETIRAcetam (KEPPRA) 500 MG tablet Take 500 mg by mouth 2 (two) times daily.   Yes Historical Provider, MD  lisinopril (PRINIVIL,ZESTRIL) 20 MG tablet Take 20 mg by mouth daily.   Yes Historical Provider, MD  metoprolol tartrate (LOPRESSOR) 25 MG tablet Take 25  mg by mouth 2 (two) times daily. Check pulse hold if <60   Yes Historical Provider, MD  oseltamivir (TAMIFLU) 75 MG capsule Take 75 mg by mouth 2 (two) times daily. 01/23/17 01/27/17 Yes Historical Provider, MD  pantoprazole (PROTONIX) 20 MG tablet Take 20 mg by mouth daily.   Yes Historical Provider, MD      VITAL SIGNS:  Blood pressure 108/61, pulse 62, temperature 98 F (36.7 C), temperature source Oral, resp. rate 20, height 5\' 2"  (1.575 m), weight 63.5 kg (140 lb), SpO2 100 %.  PHYSICAL EXAMINATION:  GENERAL:  81  y.o.-year-old patient lying in the bed with no acute distress.  EYES: Pupils equal, round, reactive to light and accommodation. No scleral icterus. Extraocular muscles intact.  HEENT: Head atraumatic, normocephalic. Oropharynx and nasopharynx clear.  NECK:  Supple, no jugular venous distention. No thyroid enlargement, no tenderness.  LUNGS: Poor air entry with decreased breath sounds bilaterally, positive upper airway wheezing, no rales,rhonchi or crepitation. Positive use of accessory muscles of respiration.  CARDIOVASCULAR: S1, S2 normal. No murmurs, rubs, or gallops.  ABDOMEN: Soft, nontender, nondistended. Bowel sounds present. No organomegaly or mass.  EXTREMITIES: Trace edema, no cyanosis, or clubbing.  NEUROLOGIC: Cranial nerves II through XII are intact. Muscle strength 5/5 in all extremities. Sensation intact. Gait not checked.  PSYCHIATRIC: The patient is alert and oriented x 3.  SKIN: No rash, lesion, or ulcer.   LABORATORY PANEL:   CBC  Recent Labs Lab 01/23/17 1355  WBC 5.5  HGB 12.9  HCT 38.3  PLT 145*   ------------------------------------------------------------------------------------------------------------------  Chemistries   Recent Labs Lab 01/23/17 1355  NA 137  K 3.6  CL 99*  CO2 30  GLUCOSE 122*  BUN 26*  CREATININE 1.31*  CALCIUM 8.4*   ------------------------------------------------------------------------------------------------------------------    RADIOLOGY:  Dg Chest Portable 1 View  Result Date: 01/23/2017 CLINICAL DATA:  Fever and cough for 2-3 days EXAM: PORTABLE CHEST 1 VIEW COMPARISON:  Portable chest x-ray of 11/26/2010 FINDINGS: No active infiltrate or effusion is seen. Mediastinal and hilar contours are unremarkable. The heart is borderline enlarged and stable. No acute bony abnormality is seen. IMPRESSION: No active disease. Electronically Signed   By: Dwyane DeePaul  Barry M.D.   On: 01/23/2017 15:50    EKG:   Normal sinus rhythm 62  bpm  IMPRESSION AND PLAN:   1. Hypotension responding to fluid bolus. Continue gentle IV fluid hydration and hold antihypertensive medications. 2. Influenza A positive with poor air entry bilaterally. Start budesonide and DuoNeb nebulizers and start Solu-Medrol. Continue oxygen supplementation as needed. 3. Acute kidney injury and dehydration gentle IV fluid hydration and check creatinine tomorrow morning. 4. Seizure history. Continue usual antiseizure medications 5. Thrombocytopenia 6. GERD on PPI 7. Impaired fasting glucose. Check a hemoglobin A1c  All the records are reviewed and case discussed with ED provider. Management plans discussed with the patient, family and they are in agreement.  CODE STATUS: DO NOT RESUSCITATE  TOTAL TIME TAKING CARE OF THIS PATIENT: 50 minutes.    Alford HighlandWIETING, Angelos Wasco M.D on 01/23/2017 at 4:21 PM  Between 7am to 6pm - Pager - [phone]  After 6pm call admission pager [phone]  [facility] Office  [phone]  CC: Primary care physician; Lynnea FerrierBERT J KLEIN III, MD

## 2017-01-24 LAB — BASIC METABOLIC PANEL
Anion gap: 8 (ref 5–15)
BUN: 19 mg/dL (ref 6–20)
CALCIUM: 8 mg/dL — AB (ref 8.9–10.3)
CHLORIDE: 109 mmol/L (ref 101–111)
CO2: 26 mmol/L (ref 22–32)
CREATININE: 0.9 mg/dL (ref 0.44–1.00)
GFR calc non Af Amer: 60 mL/min — ABNORMAL LOW (ref >60)
Glucose, Bld: 108 mg/dL — ABNORMAL HIGH (ref 65–99)
Potassium: 4.2 mmol/L (ref 3.5–5.1)
Sodium: 143 mmol/L (ref 135–145)

## 2017-01-24 LAB — BLOOD CULTURE ID PANEL (REFLEXED)
Acinetobacter baumannii: NOT DETECTED
CANDIDA GLABRATA: NOT DETECTED
CANDIDA TROPICALIS: NOT DETECTED
Candida albicans: NOT DETECTED
Candida krusei: NOT DETECTED
Candida parapsilosis: NOT DETECTED
ENTEROCOCCUS SPECIES: NOT DETECTED
Enterobacter cloacae complex: NOT DETECTED
Enterobacteriaceae species: NOT DETECTED
Escherichia coli: NOT DETECTED
HAEMOPHILUS INFLUENZAE: NOT DETECTED
KLEBSIELLA PNEUMONIAE: NOT DETECTED
Klebsiella oxytoca: NOT DETECTED
LISTERIA MONOCYTOGENES: NOT DETECTED
METHICILLIN RESISTANCE: NOT DETECTED
NEISSERIA MENINGITIDIS: NOT DETECTED
PROTEUS SPECIES: NOT DETECTED
Pseudomonas aeruginosa: NOT DETECTED
SERRATIA MARCESCENS: NOT DETECTED
STAPHYLOCOCCUS AUREUS BCID: NOT DETECTED
STAPHYLOCOCCUS SPECIES: DETECTED — AB
STREPTOCOCCUS AGALACTIAE: NOT DETECTED
Streptococcus pneumoniae: NOT DETECTED
Streptococcus pyogenes: NOT DETECTED
Streptococcus species: NOT DETECTED

## 2017-01-24 LAB — CBC
HCT: 30.9 % — ABNORMAL LOW (ref 35.0–47.0)
Hemoglobin: 10.8 g/dL — ABNORMAL LOW (ref 12.0–16.0)
MCH: 30.5 pg (ref 26.0–34.0)
MCHC: 34.8 g/dL (ref 32.0–36.0)
MCV: 87.7 fL (ref 80.0–100.0)
PLATELETS: 117 10*3/uL — AB (ref 150–440)
RBC: 3.53 MIL/uL — AB (ref 3.80–5.20)
RDW: 13.3 % (ref 11.5–14.5)
WBC: 3.3 10*3/uL — ABNORMAL LOW (ref 3.6–11.0)

## 2017-01-24 LAB — HEMOGLOBIN A1C
Hgb A1c MFr Bld: 5.5 % (ref 4.8–5.6)
Mean Plasma Glucose: 111 mg/dL

## 2017-01-24 MED ORDER — DEXTROSE 5 % IV SOLN
500.0000 mg | Freq: Three times a day (TID) | INTRAVENOUS | Status: DC
  Administered 2017-01-24 (×2): 500 mg via INTRAVENOUS
  Filled 2017-01-24 (×6): qty 5

## 2017-01-24 MED ORDER — CALCIUM GLUCONATE 10 % IV SOLN
1.0000 g | Freq: Once | INTRAVENOUS | Status: AC
  Administered 2017-01-24: 1 g via INTRAVENOUS
  Filled 2017-01-24: qty 10

## 2017-01-24 MED ORDER — IPRATROPIUM-ALBUTEROL 0.5-2.5 (3) MG/3ML IN SOLN
3.0000 mL | Freq: Three times a day (TID) | RESPIRATORY_TRACT | Status: DC
  Administered 2017-01-25 (×2): 3 mL via RESPIRATORY_TRACT
  Filled 2017-01-24 (×2): qty 3

## 2017-01-24 MED ORDER — METOPROLOL TARTRATE 25 MG PO TABS
25.0000 mg | ORAL_TABLET | Freq: Two times a day (BID) | ORAL | Status: DC
  Administered 2017-01-24 – 2017-01-25 (×2): 25 mg via ORAL
  Filled 2017-01-24 (×2): qty 1

## 2017-01-24 MED ORDER — PANTOPRAZOLE SODIUM 40 MG PO TBEC
40.0000 mg | DELAYED_RELEASE_TABLET | Freq: Every day | ORAL | Status: DC
  Administered 2017-01-24 – 2017-01-25 (×2): 40 mg via ORAL
  Filled 2017-01-24 (×2): qty 1

## 2017-01-24 NOTE — Progress Notes (Signed)
BCID call to Clinical Pharmacist:  BCID= 2 bottles of 1 set (anaerobic and aerobic) positive for Gram Positive Cocci:  Staph. SPECIES, MecA negative.  Called to Erenest Blankavid Besanti  Branden Vine PharmD Clinical Pharmacist 01/24/2017

## 2017-01-24 NOTE — [place] FL2 (Signed)
[place] MEDICAID FL2 LEVEL OF CARE SCREENING TOOL     IDENTIFICATION  Patient Name: Gina Hampton Birthdate: May 07, 1936 Sex: female Admission Date (Current Location): 01/23/2017  [place] and [place] Number:  Chiropodist and Address:  [facility], [address], [place], [place] [address]      Provider Number: [id number]  Attending Physician Name and Address:  Adrian Saran, MD  Relative Name and Phone Number:       Current Level of Care: Hospital Recommended Level of Care: Skilled Nursing Facility  Prior Approval Number:    Date Approved/Denied:   PASRR Number:  [id number])  Discharge Plan: Skilled Nursing Facility    Current Diagnoses: Patient Active Problem List   Diagnosis Date Noted  . Influenza A 01/23/2017    Orientation RESPIRATION BLADDER Height & Weight     Self, Place  O2 (Nasal Cannula 2L/min) Incontinent Weight: 140 lb (63.5 kg) Height:  5\' 2"  (157.5 cm)  BEHAVIORAL SYMPTOMS/MOOD NEUROLOGICAL BOWEL NUTRITION STATUS   (None. )  (None. ) Continent Diet (Diet: 2 gram sodium )  AMBULATORY STATUS COMMUNICATION OF NEEDS Skin   Limited Assist Verbally Normal                       Personal Care Assistance Level of Assistance  Bathing, Feeding, Dressing Bathing Assistance: Limited assistance Feeding assistance: Independent Dressing Assistance: Limited assistance  Ambulation Assistance: Extensive Assistance    Functional Limitations Info  Sight, Hearing, Speech Sight Info: Adequate Hearing Info: Adequate Speech Info: Adequate    SPECIAL CARE FACTORS FREQUENCY  PT (By licensed PT), OT (By licensed OT)                    Contractures      Additional Factors Info  Code Status, Allergies Code Status Info:  (DNR) Allergies Info:  (No Known Allergies)           Current Medications (01/24/2017):  This is the current hospital active medication list Current Facility-Administered Medications  Medication  Dose Route Frequency Provider Last Rate Last Dose  . acetaminophen (TYLENOL) tablet 650 mg  650 mg Oral Q6H PRN Alford Highland, MD       Or  . acetaminophen (TYLENOL) suppository 650 mg  650 mg Rectal Q6H PRN Alford Highland, MD      . azelastine (ASTELIN) 0.1 % nasal spray 1 spray  1 spray Each Nare BID Alford Highland, MD   1 spray at 01/24/17 [phone]  . budesonide (PULMICORT) nebulizer solution 0.25 mg  0.25 mg Nebulization BID Alford Highland, MD   0.25 mg at 01/24/17 0817  . carbamazepine (TEGRETOL) tablet 200 mg  200 mg Oral BID Alford Highland, MD   200 mg at 01/24/17 3086  . citalopram (CELEXA) tablet 20 mg  20 mg Oral Daily Alford Highland, MD   20 mg at 01/24/17 0924  . donepezil (ARICEPT) tablet 10 mg  10 mg Oral QHS Alford Highland, MD   10 mg at 01/23/17 2127  . enoxaparin (LOVENOX) injection 40 mg  40 mg Subcutaneous Q24H Alford Highland, MD   40 mg at 01/23/17 2127  . gabapentin (NEURONTIN) capsule 300 mg  300 mg Oral q morning - 10a Alford Highland, MD   300 mg at 01/24/17 5784  . gabapentin (NEURONTIN) tablet 600 mg  600 mg Oral QHS Alford Highland, MD   600 mg at 01/23/17 2127  . ipratropium-albuterol (DUONEB) 0.5-2.5 (3) MG/3ML nebulizer solution 3  mL  3 mL Nebulization Q6H Alford Highlandichard Wieting, MD   3 mL at 01/24/17 0817  . levETIRAcetam (KEPPRA) tablet 500 mg  500 mg Oral BID Alford Highlandichard Wieting, MD   500 mg at 01/24/17 [id number]  . loratadine (CLARITIN) tablet 10 mg  10 mg Oral Daily Alford Highlandichard Wieting, MD   10 mg at 01/24/17 0924  . methylPREDNISolone sodium succinate (SOLU-MEDROL) 40 mg/mL injection 40 mg  40 mg Intravenous Daily Alford Highlandichard Wieting, MD   40 mg at 01/24/17 0924  . metoprolol tartrate (LOPRESSOR) tablet 25 mg  25 mg Oral BID Adrian SaranSital Mody, MD      . oseltamivir (TAMIFLU) capsule 30 mg  30 mg Oral BID Alford Highlandichard Wieting, MD   30 mg at 01/24/17 0924  . pantoprazole (PROTONIX) EC tablet 40 mg  40 mg Oral Daily Alford Highlandichard Wieting, MD   40 mg at 01/24/17 [id number]     Discharge  Medications: Please see discharge summary for a list of discharge medications.  Relevant Imaging Results:  Relevant Lab Results:   Additional Information  (SSN: [id number])  Ralene BatheMackenzie Kahleah Crass, Student-Social Work

## 2017-01-24 NOTE — Clinical Social Work Placement (Signed)
   CLINICAL SOCIAL WORK PLACEMENT  NOTE  Date:  01/24/2017  Patient Details  Name: Gina Hampton MRN: [id number] Date of Birth: [birth year]  Clinical Social Work is seeking post-discharge placement for this patient at the Skilled  Nursing Facility level of care (*CSW will initial, date and re-position this form in  chart as items are completed):  Yes   Patient/family provided with [facility] Clinical Social Work Department's list of facilities offering this level of care within the geographic area requested by the patient (or if unable, by the patient's family).  Yes   Patient/family informed of their freedom to choose among providers that offer the needed level of care, that participate in Medicare, Medicaid or managed care program needed by the patient, have an available bed and are willing to accept the patient.  Yes   Patient/family informed of [facility]'s ownership interest in [facility] and [facility], as well as of the fact that they are under no obligation to receive care at these facilities.  PASRR submitted to EDS on       PASRR number received on       Existing PASRR number confirmed on 01/24/17     FL2 transmitted to all facilities in geographic area requested by pt/family on       FL2 transmitted to all facilities within larger geographic area on 01/24/17     Patient informed that his/her managed care company has contracts with or will negotiate with certain facilities, including the following:            Patient/family informed of bed offers received.  Patient chooses bed at       Physician recommends and patient chooses bed at      Patient to be transferred to   on  .  Patient to be transferred to facility by       Patient family notified on   of transfer.  Name of family member notified:        PHYSICIAN       Additional Comment:    _______________________________________________ Ralene BatheMackenzie Floyce Bujak, Student-Social Work 01/24/2017, 11:12  AM

## 2017-01-24 NOTE — Plan of Care (Signed)
Problem: Bowel/Gastric: Goal: Will not experience complications related to bowel motility Outcome: Progressing Pt is progressing toward most goals,has had some difficulty assimilating information, need repeated explanations for changes in care. Pt has remained free of falls/injury this shift.

## 2017-01-24 NOTE — Clinical Social Work Note (Addendum)
Clinical Social Work Assessment  Patient Details  Name: Gina Hampton MRN: [id number] Date of Birth: August 30, 1936  Date of referral:  01/24/17               Reason for consult:  Facility Placement, Discharge Planning                Permission sought to share information with:  Chartered certified accountant granted to share information::  Yes, Verbal Permission Granted  Name::      [place]::   [facility])  Relationship::     Contact Information:     Housing/Transportation Living arrangements for the past 2 months:  [place] ([facility]) Source of Information:  Patient, Adult Children, Facility Patient Interpreter Needed:  None Criminal Activity/Legal Involvement Pertinent to Current Situation/Hospitalization:  No - Comment as needed Significant Relationships:  Adult Children, Siblings Lives with:  Facility Resident ([facility]) Do you feel safe going back to the place where you live?  Yes Need for family participation in patient care:  Yes (Comment)  Care giving concerns:  Patient is a long-term resident at [facility].    Social Worker assessment / plan: Social work Theatre manager received social work consult. PT is recommending SNF. Social work Theatre manager met with patient at bedside chair. Patient was sitting up eating breakfast. Social work Theatre manager introduced herself and explained role of social work department. Per patient, she has lived at [facility] for several years now. Patient has one daughter that lives in [place]. Patient sister, Pamala Hurry is patient's HPOA. Per patient, patient sister is living in [place] for the winter. Social work Theatre manager contacted Pamala Hurry and confirmed the information given above and said that patient has lived at [place] for about 5 years now. Social work Theatre manager explained to patient's sister that PT is recommending SNF. Patient's sister was concerned about how she was  going to pay for facility placement. Social work Theatre manager explained that [employer] authorization will have to be given. Patient's sister verbally agreed she understood and felt better about the situation. Social work Theatre manager also confirmed with Horris Latino from [facility] that patient is a resident. Horris Latino confirmed and said that patient can return when medically stable. Horris Latino said that patient is on no oxygen and walks with a walker. Social work Theatre manager will keep patient's sister and Horris Latino updated on patient's progress. Social work Theatre manager will continue to assist and follow as needed.  [employer] SNF authorization was started today via [facility].   Fl2 completed and faxed out.   Employment status:  Unemployed Nurse, adult PT Recommendations:  [place] / Referral to community resources:  Other (Comment Required) ([facility])  Patient/Family's Response to care:  Patient's sister is agreeable to SNF placement for patient. Patient's sister did not have a preference at this time.   Patient/Family's Understanding of and Emotional Response to Diagnosis, Current Treatment, and Prognosis:  Patient's sister and Horris Latino were pleasant and thanked social work Theatre manager for calling.   Emotional Assessment Appearance:  Appears stated age Attitude/Demeanor/Rapport:    Affect (typically observed):  Accepting, Adaptable, Appropriate Orientation:  Oriented to Self, Oriented to Place Alcohol / Substance use:  Not Applicable Psych involvement (Current and /or in the community):  No (Comment)  Discharge Needs  Concerns to be addressed:  Basic Needs Readmission within the last 30 days:  No Current discharge risk:  Chronically ill Barriers to Discharge:  Continued Medical Work up   [facility], [place] Work 01/24/2017, 11:13 AM

## 2017-01-24 NOTE — Progress Notes (Signed)
ANTIBIOTIC CONSULT NOTE - INITIAL  Pharmacy Consult for Cefazolin Indication: bacteremia  No Known Allergies  Patient Measurements: Height: 5\' 2"  (157.5 cm) Weight: 140 lb (63.5 kg) IBW/kg (Calculated) : 50.1 Adjusted Body Weight:   Vital Signs: Temp: 98.1 F (36.7 C) (01/25 1202) Temp Source: Oral (01/25 1202) BP: 157/72 (01/25 1202) Pulse Rate: 77 (01/25 1202) Intake/Output from previous day: No intake/output data recorded. Intake/Output from this shift: No intake/output data recorded.  Labs:  Recent Labs  01/23/17 1355 01/24/17 0431  WBC 5.5 3.3*  HGB 12.9 10.8*  PLT 145* 117*  CREATININE 1.31* 0.90   Estimated Creatinine Clearance: 45.1 mL/min (by C-G formula based on SCr of 0.9 mg/dL). No results for input(s): VANCOTROUGH, VANCOPEAK, VANCORANDOM, GENTTROUGH, GENTPEAK, GENTRANDOM, TOBRATROUGH, TOBRAPEAK, TOBRARND, AMIKACINPEAK, AMIKACINTROU, AMIKACIN in the last 72 hours.   Microbiology: Recent Results (from the past 720 hour(s))  Culture, blood (Routine X 2) w Reflex to ID Panel     Status: None (Preliminary result)   Collection Time: 01/23/17  2:43 PM  Result Value Ref Range Status   Specimen Description BLOOD R AC  Final   Special Requests   Final    BOTTLES DRAWN AEROBIC AND ANAEROBIC  ANA 8 AER 6 ML   Culture NO GROWTH < 24 HOURS  Final   Report Status PENDING  Incomplete  Culture, blood (Routine X 2) w Reflex to ID Panel     Status: None (Preliminary result)   Collection Time: 01/23/17  2:43 PM  Result Value Ref Range Status   Specimen Description BLOOD R AC  Final   Special Requests BOTTLES DRAWN AEROBIC AND ANAEROBIC ANA 11 AER 13   Final   Culture  Setup Time   Final    Organism ID to follow GRAM POSITIVE COCCI IN BOTH AEROBIC AND ANAEROBIC BOTTLES CRITICAL RESULT CALLED TO, READ BACK BY AND VERIFIED WITH: KRISTEN MERRILL AT 1338 ON 01/24/17 [facility].    Culture NO GROWTH < 24 HOURS  Final   Report Status PENDING  Incomplete  Blood Culture ID Panel  (Reflexed)     Status: Abnormal   Collection Time: 01/23/17  2:43 PM  Result Value Ref Range Status   Enterococcus species NOT DETECTED NOT DETECTED Final   Listeria monocytogenes NOT DETECTED NOT DETECTED Final   Staphylococcus species DETECTED (A) NOT DETECTED Final    Comment: CRITICAL RESULT CALLED TO, READ BACK BY AND VERIFIED WITH: KRISTEN MERRILL AT 1338 ON 01/24/17 [facility].    Staphylococcus aureus NOT DETECTED NOT DETECTED Final   Methicillin resistance NOT DETECTED NOT DETECTED Final   Streptococcus species NOT DETECTED NOT DETECTED Final   Streptococcus agalactiae NOT DETECTED NOT DETECTED Final   Streptococcus pneumoniae NOT DETECTED NOT DETECTED Final   Streptococcus pyogenes NOT DETECTED NOT DETECTED Final   Acinetobacter baumannii NOT DETECTED NOT DETECTED Final   Enterobacteriaceae species NOT DETECTED NOT DETECTED Final   Enterobacter cloacae complex NOT DETECTED NOT DETECTED Final   Escherichia coli NOT DETECTED NOT DETECTED Final   Klebsiella oxytoca NOT DETECTED NOT DETECTED Final   Klebsiella pneumoniae NOT DETECTED NOT DETECTED Final   Proteus species NOT DETECTED NOT DETECTED Final   Serratia marcescens NOT DETECTED NOT DETECTED Final   Haemophilus influenzae NOT DETECTED NOT DETECTED Final   Neisseria meningitidis NOT DETECTED NOT DETECTED Final   Pseudomonas aeruginosa NOT DETECTED NOT DETECTED Final   Candida albicans NOT DETECTED NOT DETECTED Final   Candida glabrata NOT DETECTED NOT DETECTED Final   Candida krusei  NOT DETECTED NOT DETECTED Final   Candida parapsilosis NOT DETECTED NOT DETECTED Final   Candida tropicalis NOT DETECTED NOT DETECTED Final  Gastrointestinal Panel by PCR , Stool     Status: None   Collection Time: 01/23/17  4:20 PM  Result Value Ref Range Status   Campylobacter species NOT DETECTED NOT DETECTED Final   Plesimonas shigelloides NOT DETECTED NOT DETECTED Final   Salmonella species NOT DETECTED NOT DETECTED Final   Yersinia  enterocolitica NOT DETECTED NOT DETECTED Final   Vibrio species NOT DETECTED NOT DETECTED Final   Vibrio cholerae NOT DETECTED NOT DETECTED Final   Enteroaggregative E coli (EAEC) NOT DETECTED NOT DETECTED Final   Enteropathogenic E coli (EPEC) NOT DETECTED NOT DETECTED Final   Enterotoxigenic E coli (ETEC) NOT DETECTED NOT DETECTED Final   Shiga like toxin producing E coli (STEC) NOT DETECTED NOT DETECTED Final   Shigella/Enteroinvasive E coli (EIEC) NOT DETECTED NOT DETECTED Final   Cryptosporidium NOT DETECTED NOT DETECTED Final   Cyclospora cayetanensis NOT DETECTED NOT DETECTED Final   Entamoeba histolytica NOT DETECTED NOT DETECTED Final   Giardia lamblia NOT DETECTED NOT DETECTED Final   Adenovirus F40/41 NOT DETECTED NOT DETECTED Final   Astrovirus NOT DETECTED NOT DETECTED Final   Norovirus GI/GII NOT DETECTED NOT DETECTED Final   Rotavirus A NOT DETECTED NOT DETECTED Final   Sapovirus (I, II, IV, and V) NOT DETECTED NOT DETECTED Final    Medical History: Past Medical History:  Diagnosis Date  . Hypertension   . Seizures (HCC)     Medications:  Scheduled:  . azelastine  1 spray Each Nare BID  . budesonide (PULMICORT) nebulizer solution  0.25 mg Nebulization BID  . calcium gluconate  1 g Intravenous Once  . carbamazepine  200 mg Oral BID  .  ceFAZolin (ANCEF) IV  500 mg Intravenous Q8H  . citalopram  20 mg Oral Daily  . donepezil  10 mg Oral QHS  . enoxaparin (LOVENOX) injection  40 mg Subcutaneous Q24H  . gabapentin  300 mg Oral q morning - 10a  . gabapentin  600 mg Oral QHS  . ipratropium-albuterol  3 mL Nebulization Q6H  . levETIRAcetam  500 mg Oral BID  . loratadine  10 mg Oral Daily  . methylPREDNISolone (SOLU-MEDROL) injection  40 mg Intravenous Daily  . metoprolol tartrate  25 mg Oral BID  . oseltamivir  30 mg Oral BID  . pantoprazole  40 mg Oral Daily   Assessment: Patient was admitted with respiratory symptoms, found to be flu +. Bcx 2 bottles in one  set (anaerobic/aerobic) growing staph species, MecA -  Goal of Therapy:  Resolution of infx symptoms  Plan:  Starting Cefazolin 500 mg q8h with repeat blood cultures. Follow up culture results  Thomasene Ripple, PharmD, BCPS Clinical Pharmacist 01/24/2017

## 2017-01-24 NOTE — Care Management (Addendum)
RNCM consult received and will continue to follow for home health and DME needs. CSW consult placed as it appears patient is from [facility]- which has independent living however I am not sure which unit patient is from. PT consult pending.

## 2017-01-24 NOTE — Progress Notes (Addendum)
[facility] [place] at [facility]   PATIENT NAME: Gina Hampton    MR#:  [id number]  DATE OF BIRTH:  1936-11-15  SUBJECTIVE:   Patient here with weakness and +INF A  REVIEW OF SYSTEMS:    Review of Systems  Constitutional: Positive for malaise/fatigue. Negative for chills and fever.  HENT: Negative.  Negative for ear discharge, ear pain, hearing loss, nosebleeds and sore throat.   Eyes: Negative.  Negative for blurred vision and pain.  Respiratory: Positive for cough. Negative for hemoptysis, shortness of breath and wheezing.   Cardiovascular: Negative.  Negative for chest pain, palpitations and leg swelling.  Gastrointestinal: Negative.  Negative for abdominal pain, blood in stool, diarrhea, nausea and vomiting.  Genitourinary: Negative.  Negative for dysuria.  Musculoskeletal: Negative.  Negative for back pain.  Skin: Negative.   Neurological: Positive for weakness. Negative for dizziness, tremors, speech change, focal weakness, seizures and headaches.  Endo/Heme/Allergies: Negative.  Does not bruise/bleed easily.  Psychiatric/Behavioral: Negative.  Negative for depression, hallucinations and suicidal ideas.    Tolerating Diet: yes      DRUG ALLERGIES:  No Known Allergies  VITALS:  Blood pressure (!) 141/58, pulse 81, temperature 98 F (36.7 C), temperature source Oral, resp. rate 18, height 5\' 2"  (1.575 m), weight 63.5 kg (140 lb), SpO2 95 %.  PHYSICAL EXAMINATION:   Physical Exam  Constitutional: She is well-developed, well-nourished, and in no distress. No distress.  HENT:  Head: Normocephalic.  Eyes: No scleral icterus.  Neck: Normal range of motion. Neck supple. No JVD present. No tracheal deviation present.  Cardiovascular: Normal rate, regular rhythm and normal heart sounds.  Exam reveals no gallop and no friction rub.   No murmur heard. Pulmonary/Chest: Effort normal. No respiratory distress. She has wheezes. She has no rales. She exhibits  no tenderness.  Abdominal: Soft. Bowel sounds are normal. She exhibits no distension and no mass. There is no tenderness. There is no rebound and no guarding.  Musculoskeletal: Normal range of motion. She exhibits no edema.  Neurological: She is alert.  Hammer toes  Skin: Skin is warm. No rash noted. No erythema.  Psychiatric: Affect normal.      LABORATORY PANEL:   CBC  Recent Labs Lab 01/24/17 0431  WBC 3.3*  HGB 10.8*  HCT 30.9*  PLT 117*   ------------------------------------------------------------------------------------------------------------------  Chemistries   Recent Labs Lab 01/24/17 0431  NA 143  K 4.2  CL 109  CO2 26  GLUCOSE 108*  BUN 19  CREATININE 0.90  CALCIUM 8.0*   ------------------------------------------------------------------------------------------------------------------  Cardiac Enzymes No results for input(s): TROPONINI in the last 168 hours. ------------------------------------------------------------------------------------------------------------------  RADIOLOGY:  Dg Chest Portable 1 View  Result Date: 01/23/2017 CLINICAL DATA:  Fever and cough for 2-3 days EXAM: PORTABLE CHEST 1 VIEW COMPARISON:  Portable chest x-ray of 11/26/2010 FINDINGS: No active infiltrate or effusion is seen. Mediastinal and hilar contours are unremarkable. The heart is borderline enlarged and stable. No acute bony abnormality is seen. IMPRESSION: No active disease. Electronically Signed   By: Dwyane Dee M.D.   On: 01/23/2017 15:50     ASSESSMENT AND PLAN:     81 y/o female with HTN here with weakness.  1. Hypotension which has responded to fluid bolus. Stop IVF.  2.Acute hypoxic resp failure due to Influenza A and reactive airways: Continue TAMIFLU for a total of 5 days. Continue steroids and INHALERS for wheezing  3. Acute kidney injury due to dehydration: this has improved with IVF.  4. Seizure history. Continue Tegretol  5.  Thrombocytopenia chronic 6. GERD on PPI  7. Essential HTN: Can restart metoprolol this am and perhaps LISINOPRIL in am  Management plans discussed with the patient and she is in agreement.  CODE STATUS: DNR  TOTAL TIME TAKING CARE OF THIS PATIENT: 33 minutes.    PT consult for dispo.  POSSIBLE D/C tomorrow, DEPENDING ON CLINICAL CONDITION.   Shakora Nordquist M.D on 01/24/2017 at 9:46 AM  Between 7am to 6pm - Pager - [phone] After 6pm go to [email or web address] - password [email or web address]  [facility] [place] [facility]  Office  [phone]  CC: Primary care physician; Lynnea FerrierBERT J KLEIN III, MD  Note: This dictation was prepared with Dragon dictation along with smaller phrase technology. Any transcriptional errors that result from this process are unintentional.

## 2017-01-24 NOTE — Evaluation (Signed)
Physical Therapy Evaluation Patient Details Name: Henriette Combsancy C Dragos MRN: [id number] DOB: 07-May-1936 Today's Date: 01/24/2017   History of Present Illness  Pt 81 yo female; hospital diagnosis of Influenza A. Per hospitatlist has a history of HTN and seizures   Clinical Impression  Pt awake, alert and oriented to time and place, able to follow basic commands. Pt stated she was feeling okay this morning. Gross UE and L LE strength appear WFL and grossly at least 4/5. R LE weaker than L grossly at least 3+/5. Pt at 97% on 2 L/min of O2 and weaned to room air; remained above 91% throughout eval. Pt lives alone at ALF and was ambulating with RW to bathroom and hallway and baseline.Unsure on level of assistance required at baseline. Pt reported history of frequent falls. She was able to perform bed mobility with mod assist and cuing to get to EOB. Able to maintain sitting posture w/ forward flexed head and neck and w/o UE support. Pt transferred to standing using RW and mod assist from PT . Stated she felt unsteady standing w/ RW and continued to display forward flexed posture. Pt able to ambulate to chair (3 feet) w/ min assist and cuing for safety. She currently displays balance, strength and ambulation deficits, as well as poor activity tolerance that prevent safe housheold ambulation. She will benefit from skilled PT to improve above deficits; recommend pt dc to STR.      Follow Up Recommendations SNF    Equipment Recommendations  Rolling walker with 5" wheels    Recommendations for Other Services OT consult     Precautions / Restrictions Precautions Precautions: Fall Restrictions Weight Bearing Restrictions: No      Mobility  Bed Mobility Overal bed mobility: Needs Assistance Bed Mobility: Supine to Sit     Supine to sit: Mod assist     General bed mobility comments: cuing for hand placement and mod assist to erect trunk  Transfers Overall transfer level: Needs assistance Equipment  used: Rolling walker (2 wheeled) Transfers: Sit to/from Stand Sit to Stand: Mod assist         General transfer comment: Use of UE for lift off, forward flexed in standing  Ambulation/Gait Ambulation/Gait assistance: Min assist Ambulation Distance (Feet): 3 Feet Assistive device: Rolling walker (2 wheeled) Gait Pattern/deviations: Step-to pattern;Decreased stride length;Shuffle;Trunk flexed   Gait velocity interpretation: Below normal speed for age/gender General Gait Details: Gait limited by fatigue, cuing for turning to chair  Stairs            Wheelchair Mobility    Modified Rankin (Stroke Patients Only)       Balance Overall balance assessment: Needs assistance Sitting-balance support: No upper extremity supported Sitting balance-Leahy Scale: Fair Sitting balance - Comments: flexed posture, post pelvic tilt, sacral sitting    Standing balance support: Bilateral upper extremity supported Standing balance-Leahy Scale: Poor Standing balance comment: stated she felt unsteady even w/ RW                              Pertinent Vitals/Pain Pain Assessment: No/denies pain    Home Living Family/patient expects to be discharged to:: Assisted living               Home Equipment: Walker - 4 wheels      Prior Function Level of Independence: Independent with assistive device(s)         Comments: Able to ambulate to and from  bathroom and to hallway with AD, has history of falls     Hand Dominance        Extremity/Trunk Assessment   Upper Extremity Assessment Upper Extremity Assessment: Overall WFL for tasks assessed (grossly at least 4/5)    Lower Extremity Assessment Lower Extremity Assessment: Generalized weakness RLE Deficits / Details: Decreased overall strength compared to L LE, grossly at least 3/5    Cervical / Trunk Assessment Cervical / Trunk Assessment: Kyphotic  Communication   Communication: No difficulties  Cognition  Arousal/Alertness: Awake/alert Behavior During Therapy: WFL for tasks assessed/performed Overall Cognitive Status: No family/caregiver present to determine baseline cognitive functioning                 General Comments: Oriented to time and place, able to follow commands and answer questions    General Comments      Exercises Other Exercises Other Exercises: Supine therex; AROM to improve strength/endurance for functional tasks; 1x12, Ankle pumps, Heel slides, Hip abd, - cuing , pt demonstrated increased work of breathing   Assessment/Plan    PT Assessment Patient needs continued PT services  PT Problem List Decreased strength;Decreased activity tolerance;Decreased balance;Decreased knowledge of use of DME;Decreased mobility;Cardiopulmonary status limiting activity;Decreased range of motion          PT Treatment Interventions DME instruction;Gait training;Therapeutic exercise;Balance training;Therapeutic activities;Patient/family education;Functional mobility training    PT Goals (Current goals can be found in the Care Plan section)  Acute Rehab PT Goals Patient Stated Goal: To get better and return home PT Goal Formulation: With patient Time For Goal Achievement: 02/07/17 Potential to Achieve Goals: Fair    Frequency Min 2X/week   Barriers to discharge Decreased caregiver support;Inaccessible home environment      Co-evaluation               End of Session Equipment Utilized During Treatment: Gait belt Activity Tolerance: Patient limited by fatigue Patient left: in chair;with call bell/phone within reach;with chair alarm set Nurse Communication: Mobility status         Time: 1005-1034 PT Time Calculation (min) (ACUTE ONLY): 29 min   Charges:         PT G Codes:        Joseph Johns 01/24/2017, 10:53 AM

## 2017-01-24 NOTE — Progress Notes (Signed)
MEDICATION RELATED CONSULT NOTE - INITIAL   Pharmacy Consult for Electrolyte management Indication: Hypocalcemia  No Known Allergies  Patient Measurements: Height: 5\' 2"  (157.5 cm) Weight: 140 lb (63.5 kg) IBW/kg (Calculated) : 50.1 Adjusted Body Weight:   Vital Signs: Temp: 98.1 F (36.7 C) (01/25 1202) Temp Source: Oral (01/25 1202) BP: 157/72 (01/25 1202) Pulse Rate: 77 (01/25 1202) Intake/Output from previous day: No intake/output data recorded. Intake/Output from this shift: No intake/output data recorded.  Labs:  Recent Labs  01/23/17 1355 01/24/17 0431  WBC 5.5 3.3*  HGB 12.9 10.8*  HCT 38.3 30.9*  PLT 145* 117*  CREATININE 1.31* 0.90   Estimated Creatinine Clearance: 45.1 mL/min (by C-G formula based on SCr of 0.9 mg/dL).   Microbiology: Recent Results (from the past 720 hour(s))  Culture, blood (Routine X 2) w Reflex to ID Panel     Status: None (Preliminary result)   Collection Time: 01/23/17  2:43 PM  Result Value Ref Range Status   Specimen Description BLOOD R AC  Final   Special Requests   Final    BOTTLES DRAWN AEROBIC AND ANAEROBIC  ANA 8 AER 6 ML   Culture NO GROWTH < 24 HOURS  Final   Report Status PENDING  Incomplete  Culture, blood (Routine X 2) w Reflex to ID Panel     Status: None (Preliminary result)   Collection Time: 01/23/17  2:43 PM  Result Value Ref Range Status   Specimen Description BLOOD R AC  Final   Special Requests BOTTLES DRAWN AEROBIC AND ANAEROBIC ANA 11 AER 13   Final   Culture  Setup Time Organism ID to follow  Final   Culture NO GROWTH < 24 HOURS  Final   Report Status PENDING  Incomplete  Gastrointestinal Panel by PCR , Stool     Status: None   Collection Time: 01/23/17  4:20 PM  Result Value Ref Range Status   Campylobacter species NOT DETECTED NOT DETECTED Final   Plesimonas shigelloides NOT DETECTED NOT DETECTED Final   Salmonella species NOT DETECTED NOT DETECTED Final   Yersinia enterocolitica NOT DETECTED NOT  DETECTED Final   Vibrio species NOT DETECTED NOT DETECTED Final   Vibrio cholerae NOT DETECTED NOT DETECTED Final   Enteroaggregative E coli (EAEC) NOT DETECTED NOT DETECTED Final   Enteropathogenic E coli (EPEC) NOT DETECTED NOT DETECTED Final   Enterotoxigenic E coli (ETEC) NOT DETECTED NOT DETECTED Final   Shiga like toxin producing E coli (STEC) NOT DETECTED NOT DETECTED Final   Shigella/Enteroinvasive E coli (EIEC) NOT DETECTED NOT DETECTED Final   Cryptosporidium NOT DETECTED NOT DETECTED Final   Cyclospora cayetanensis NOT DETECTED NOT DETECTED Final   Entamoeba histolytica NOT DETECTED NOT DETECTED Final   Giardia lamblia NOT DETECTED NOT DETECTED Final   Adenovirus F40/41 NOT DETECTED NOT DETECTED Final   Astrovirus NOT DETECTED NOT DETECTED Final   Norovirus GI/GII NOT DETECTED NOT DETECTED Final   Rotavirus A NOT DETECTED NOT DETECTED Final   Sapovirus (I, II, IV, and V) NOT DETECTED NOT DETECTED Final    Medical History: Past Medical History:  Diagnosis Date  . Hypertension   . Seizures (HCC)     Medications:  Scheduled:  . azelastine  1 spray Each Nare BID  . budesonide (PULMICORT) nebulizer solution  0.25 mg Nebulization BID  . calcium gluconate  1 g Intravenous Once  . carbamazepine  200 mg Oral BID  . citalopram  20 mg Oral Daily  . donepezil  10 mg Oral QHS  . enoxaparin (LOVENOX) injection  40 mg Subcutaneous Q24H  . gabapentin  300 mg Oral q morning - 10a  . gabapentin  600 mg Oral QHS  . ipratropium-albuterol  3 mL Nebulization Q6H  . levETIRAcetam  500 mg Oral BID  . loratadine  10 mg Oral Daily  . methylPREDNISolone (SOLU-MEDROL) injection  40 mg Intravenous Daily  . metoprolol tartrate  25 mg Oral BID  . oseltamivir  30 mg Oral BID  . pantoprazole  40 mg Oral Daily    Assessment: Patient admitted for flu+ and resp symptoms. Also hypocalcemic at Ca 8.0  Goal of Therapy:  Ca 8.6 - 10.2  Plan:  Replace w/ calcium gluconate 1g IV x 1 then  recheck Ca w/ AM labs 1/26.  Thank you for this consult.  Thomasene Rippleavid Ceriah Kohler, PharmD, BCPS Clinical Pharmacist 01/24/2017

## 2017-01-24 NOTE — Progress Notes (Signed)
Shift assessment completed at 0815. Pt is awake, alert and oriented to self and place. Skin is warm and dry, intact, o2 on at [place], pt has expiratory wheeze to LUL. Respirations are unlabored. HR is regular, abdomen is soft, bs heard. Pt is wearing incontinence brief,ppp, no edema noted. PIV #22 intact to L fa with site free of redness and swelling, iv ns infusing at 40 mls/hr. PIV #20 intact to rac with site asymptomatic. Pt denied pain. Since assessment, piv to Rac has been dc'd due to being dislodged when pt was toileted. ivf has been dc'd, physical therapy assessment has been completed. Pt insists that she is unable to walk, needed to be cued when being toileted to move her feet and where to find handholds on stand/pivot transfer to commode. Pt has been titrated to room air. Srx2, call bell in reach.

## 2017-01-25 LAB — CALCIUM: Calcium: 8.3 mg/dL — ABNORMAL LOW (ref 8.9–10.3)

## 2017-01-25 MED ORDER — IPRATROPIUM-ALBUTEROL 0.5-2.5 (3) MG/3ML IN SOLN: 3.0000 mL | Freq: Four times a day (QID) | RESPIRATORY_TRACT | 0 refills | Status: DC | PRN

## 2017-01-25 MED ORDER — LISINOPRIL 40 MG PO TABS: 40.0000 mg | ORAL_TABLET | Freq: Every day | ORAL | 0 refills | Status: DC

## 2017-01-25 MED ORDER — DOXYCYCLINE HYCLATE 100 MG PO CAPS: 100.0000 mg | ORAL_CAPSULE | Freq: Two times a day (BID) | ORAL | 0 refills | Status: AC

## 2017-01-25 MED ORDER — OSELTAMIVIR PHOSPHATE 30 MG PO CAPS: 30.0000 mg | ORAL_CAPSULE | Freq: Two times a day (BID) | ORAL | 0 refills | Status: AC

## 2017-01-25 MED ORDER — LISINOPRIL 20 MG PO TABS
40.0000 mg | ORAL_TABLET | Freq: Every day | ORAL | Status: DC
  Administered 2017-01-25: 40 mg via ORAL
  Filled 2017-01-25 (×2): qty 2

## 2017-01-25 MED ORDER — LISINOPRIL 20 MG PO TABS: 20.0000 mg | ORAL_TABLET | Freq: Every day | ORAL | Status: DC

## 2017-01-25 MED ORDER — PREDNISONE 50 MG PO TABS: ORAL_TABLET | ORAL | 0 refills | Status: AC

## 2017-01-25 MED ORDER — HYDRALAZINE HCL 20 MG/ML IJ SOLN
10.0000 mg | Freq: Four times a day (QID) | INTRAMUSCULAR | Status: DC | PRN
  Administered 2017-01-25: 10 mg via INTRAVENOUS
  Filled 2017-01-25: qty 1

## 2017-01-25 MED ORDER — PREDNISONE 50 MG PO TABS
50.0000 mg | ORAL_TABLET | Freq: Every day | ORAL | Status: DC
  Administered 2017-01-25: 50 mg via ORAL
  Filled 2017-01-25: qty 1

## 2017-01-25 NOTE — Progress Notes (Signed)
Clinical Child psychotherapistocial Worker (CSW) contacted patient's sister Gina Hampton and presented bed offers. Sister chose [facility]. Patient is medically stable for D/C [facility]. Auth # [id number], RVB. [facility]. RN will call report and arrange EMS for transport. [facility]. Patient's sister Gina Hampton is aware of above. Please reconsult if future social work needs arise. CSW signing off.   Gina Hughes IncorporatedBailey Graciemae Delisle, LCSW [phone]

## 2017-01-25 NOTE — Progress Notes (Signed)
Pt ready for [facility]. Report called to Tresa EndoKelly, Charity fundraiserN. Pt will transport via EMS. Called for transport. Pts VSS

## 2017-01-25 NOTE — Progress Notes (Signed)
Family Meeting Note  Advance Directive:yes  Today a meeting took place with the Patient.  The following clinical team members were present during this meeting:MD  The following were discussed:Patient's diagnosis: Influenza A , Patient's progosis: Unable to determine and Goals for treatment: DNR  Additional follow-up to be provided: DO NOT RESUSCITATE sheet signed and placed in patient's chart  Time spent during discussion:20 minutes  Yasseen Salls, MD

## 2017-01-25 NOTE — Progress Notes (Signed)
[employer] [facility] - [place] at [facility]   PATIENT NAME: Gina Hampton    MR#:  [id number]  DATE OF BIRTH:  [birth year]  SUBJECTIVE:   Patient is off of oxygen  REVIEW OF SYSTEMS:    Review of Systems  Constitutional: Positive for malaise/fatigue. Negative for chills and fever.  HENT: Negative.  Negative for ear discharge, ear pain, hearing loss, nosebleeds and sore throat.   Eyes: Negative.  Negative for blurred vision and pain.  Respiratory: Positive for cough. Negative for hemoptysis, shortness of breath and wheezing.   Cardiovascular: Negative.  Negative for chest pain, palpitations and leg swelling.  Gastrointestinal: Negative.  Negative for abdominal pain, blood in stool, diarrhea, nausea and vomiting.  Genitourinary: Negative.  Negative for dysuria.  Musculoskeletal: Negative.  Negative for back pain.  Skin: Negative.   Neurological: Positive for weakness. Negative for dizziness, tremors, speech change, focal weakness, seizures and headaches.  Endo/Heme/Allergies: Negative.  Does not bruise/bleed easily.  Psychiatric/Behavioral: Negative.  Negative for depression, hallucinations and suicidal ideas.    Tolerating Diet: yes      DRUG ALLERGIES:  No Known Allergies  VITALS:  Blood pressure (!) 180/90, pulse 76, temperature 97.3 F (36.3 C), temperature source Oral, resp. rate 20, height 5\' 2"  (1.575 m), weight 63.5 kg (140 lb), SpO2 95 %.  PHYSICAL EXAMINATION:   Physical Exam  Constitutional: She is well-developed, well-nourished, and in no distress. No distress.  HENT:  Head: Normocephalic.  Eyes: No scleral icterus.  Neck: Normal range of motion. Neck supple. No JVD present. No tracheal deviation present.  Cardiovascular: Normal rate, regular rhythm and normal heart sounds.  Exam reveals no gallop and no friction rub.   No murmur heard. Pulmonary/Chest: Effort normal. No respiratory distress. She has wheezes. She has no rales. She exhibits no  tenderness.  Abdominal: Soft. Bowel sounds are normal. She exhibits no distension and no mass. There is no tenderness. There is no rebound and no guarding.  Musculoskeletal: Normal range of motion. She exhibits no edema.  Neurological: She is alert.  Hammer toes  Skin: Skin is warm. No rash noted. No erythema.  Psychiatric: Affect normal.      LABORATORY PANEL:   CBC  Recent Labs Lab 01/24/17 0431  WBC 3.3*  HGB 10.8*  HCT 30.9*  PLT 117*   ------------------------------------------------------------------------------------------------------------------  Chemistries   Recent Labs Lab 01/24/17 0431 01/25/17 0417  NA 143  --   K 4.2  --   CL 109  --   CO2 26  --   GLUCOSE 108*  --   BUN 19  --   CREATININE 0.90  --   CALCIUM 8.0* 8.3*   ------------------------------------------------------------------------------------------------------------------  Cardiac Enzymes No results for input(s): TROPONINI in the last 168 hours. ------------------------------------------------------------------------------------------------------------------  RADIOLOGY:  Dg Chest Portable 1 View  Result Date: 01/23/2017 CLINICAL DATA:  Fever and cough for 2-3 days EXAM: PORTABLE CHEST 1 VIEW COMPARISON:  Portable chest x-ray of 11/26/2010 FINDINGS: No active infiltrate or effusion is seen. Mediastinal and hilar contours are unremarkable. The heart is borderline enlarged and stable. No acute bony abnormality is seen. IMPRESSION: No active disease. Electronically Signed   By: Dwyane Dee M.D.   On: 01/23/2017 15:50     ASSESSMENT AND PLAN:     81 y/o female with HTN here with weakness.  1. Hypotension which has responded to fluid bolus Now with accelerated hypertension  2. Acute hypoxic resp failure due to Influenza A and reactive airways: Patient has  been weaned off of oxygen  She will need to Continue TAMIFLU for a total of 5 days. She can be transitioned to oral steroids and  continue INHALERS for wheezing  3. Acute kidney injury due to dehydration: yhis has improved with IVF.  4. Seizure history. Continue Tegretol  5. Thrombocytopenia chronic 6. GERD on PPI  7. Accelerated Essential HTN: Continue metoprolol and increased dose of lisinopril to 40 mg daily.   8. 1/2 GPC staph species  bacteremia: I call laboratory and they are saying that one out of 2 cultures are staph species which is staph epi and a contaminant. Continue Ancef Management plans discussed with the patient and she is in agreement.  CODE STATUS: DNR  TOTAL TIME TAKING CARE OF THIS PATIENT: 25 minutes.    PT is recommending skilled nursing facility POSSIBLE D/C today DEPENDING ON CLINICAL CONDITION.   Ac Colan M.D on 01/25/2017 at 8:18 AM  Between 7am to 6pm - Pager - [phone] After 6pm go to [email or web address] - password [email or web address]  [facility] [place]  Office  [phone]  CC: Primary care physician; Lynnea FerrierBERT J KLEIN III, MD  Note: This dictation was prepared with Dragon dictation along with smaller phrase technology. Any transcriptional errors that result from this process are unintentional.

## 2017-01-25 NOTE — Discharge Summary (Signed)
[facility] - [place] at [facility]   PATIENT NAME: Gina Hampton    MR#:  [id number]  DATE OF BIRTH:  06-09-1936  DATE OF ADMISSION:  01/23/2017 ADMITTING PHYSICIAN: Alford Highland, MD  DATE OF DISCHARGE: 01/25/2017  PRIMARY CARE PHYSICIAN: Curtis Sites III, MD    ADMISSION DIAGNOSIS:  Delirium [R41.0] Influenza [J11.1] Hypotension, unspecified hypotension type [I95.9]  DISCHARGE DIAGNOSIS:  Active Problems:   Influenza A   SECONDARY DIAGNOSIS:   Past Medical History:  Diagnosis Date  . Hypertension   . Seizures [facility])     HOSPITAL COURSE:   81 y/o female with HTN here with weakness.  1. Hypotension which has responded to fluid bolus Now with accelerated hypertension  2. Acute hypoxic resp failure due to Influenza A and reactive airways: Patient has been weaned off of oxygen  She will need to Continue TAMIFLU for a total of 5 days. She is transitioned to oral steroids and continue INHALERS for wheezing And TIMI doxycycline for acute bronchitis. 3. Acute kidney injury due to dehydration: This has improved with IVF.  4. Seizure history. Continue Tegretol  5. Thrombocytopenia chronic 6. GERD on PPI  7. Accelerated Essential HTN: Continue metoprolol and increased dose of lisinopril to 40 mg daily.   8. 1/2 GPC staph species  bacteremia: I called laboratory and they are saying that this is staph epi and a contaminant.Physical therapy has recommended skilled nursing facility at discharge  DISCHARGE CONDITIONS AND DIET:   Stable For discharge on regular diet  CONSULTS OBTAINED:    DRUG ALLERGIES:  No Known Allergies  DISCHARGE MEDICATIONS:   Current Discharge Medication List    START taking these medications   Details  doxycycline (VIBRAMYCIN) 100 MG capsule Take 1 capsule (100 mg total) by mouth 2 (two) times daily. Qty: 6 capsule, Refills: 0    ipratropium-albuterol (DUONEB) 0.5-2.5 (3) MG/3ML SOLN Take 3 mLs by nebulization every  6 (six) hours as needed. Qty: 360 mL, Refills: 0    predniSONE (DELTASONE) 50 MG tablet Take 1 tablet daily for 3 days then stop. Qty: 3 tablet, Refills: 0      CONTINUE these medications which have CHANGED   Details  lisinopril (PRINIVIL,ZESTRIL) 40 MG tablet Take 1 tablet (40 mg total) by mouth daily. Qty: 30 tablet, Refills: 0    oseltamivir (TAMIFLU) 30 MG capsule Take 1 capsule (30 mg total) by mouth 2 (two) times daily. Qty: 4 capsule, Refills: 0      CONTINUE these medications which have NOT CHANGED   Details  acetaminophen (TYLENOL) 325 MG tablet Take 650 mg by mouth every 6 (six) hours as needed.    atorvastatin (LIPITOR) 20 MG tablet Take 20 mg by mouth daily.    azelastine (ASTELIN) 0.1 % nasal spray Place 1 spray into both nostrils 2 (two) times daily. Use in each nostril as directed    carbamazepine (TEGRETOL) 200 MG tablet Take 200 mg by mouth 2 (two) times daily.    citalopram (CELEXA) 20 MG tablet Take 20 mg by mouth daily.    donepezil (ARICEPT) 10 MG tablet Take 10 mg by mouth at bedtime.    fexofenadine (ALLEGRA) 180 MG tablet Take 180 mg by mouth daily.    gabapentin (NEURONTIN) 300 MG capsule Take 300-600 mg by mouth 2 (two) times daily. 300mg  in the morning 600mg  at bedtime    levETIRAcetam (KEPPRA) 500 MG tablet Take 500 mg by mouth 2 (two) times daily.    metoprolol tartrate (  LOPRESSOR) 25 MG tablet Take 25 mg by mouth 2 (two) times daily. Check pulse hold if <60    pantoprazole (PROTONIX) 20 MG tablet Take 20 mg by mouth daily.              Today   CHIEF COMPLAINT:  Patient doing well this morning. She has been weaned off of oxygen  VITAL SIGNS:  Blood pressure (!) 180/90, pulse 76, temperature 97.3 F (36.3 C), temperature source Oral, resp. rate 20, height 5\' 2"  (1.575 m), weight 63.5 kg (140 lb), SpO2 95 %.   REVIEW OF SYSTEMS:  Review of Systems  Constitutional: Negative.  Negative for chills, fever and malaise/fatigue.   HENT: Negative.  Negative for ear discharge, ear pain, hearing loss, nosebleeds and sore throat.   Eyes: Negative.  Negative for blurred vision and pain.  Respiratory: Negative.  Negative for cough, hemoptysis, shortness of breath and wheezing.   Cardiovascular: Negative.  Negative for chest pain, palpitations and leg swelling.  Gastrointestinal: Negative.  Negative for abdominal pain, blood in stool, diarrhea, nausea and vomiting.  Genitourinary: Negative.  Negative for dysuria.  Musculoskeletal: Positive for falls. Negative for back pain.  Skin: Negative.   Neurological: Negative for dizziness, tremors, speech change, focal weakness, seizures and headaches.  Endo/Heme/Allergies: Negative.  Does not bruise/bleed easily.  Psychiatric/Behavioral: Negative for depression, hallucinations and suicidal ideas.     PHYSICAL EXAMINATION:  GENERAL:  81 y.o.-year-old patient lying in the bed with no acute distress.  NECK:  Supple, no jugular venous distention. No thyroid enlargement, no tenderness.  LUNGS: Mild bilateral wheezing,  no rales,rhonchi  No use of accessory muscles of respiration.  CARDIOVASCULAR: S1, S2 normal. No murmurs, rubs, or gallops.  ABDOMEN: Soft, non-tender, non-distended. Bowel sounds present. No organomegaly or mass.  EXTREMITIES: No pedal edema, cyanosis, or clubbing.  PSYCHIATRIC: The patient is alert and oriented x 3.  SKIN: No obvious rash, lesion, or ulcer.   DATA REVIEW:   CBC  Recent Labs Lab 01/24/17 0431  WBC 3.3*  HGB 10.8*  HCT 30.9*  PLT 117*    Chemistries   Recent Labs Lab 01/24/17 0431 01/25/17 0417  NA 143  --   K 4.2  --   CL 109  --   CO2 26  --   GLUCOSE 108*  --   BUN 19  --   CREATININE 0.90  --   CALCIUM 8.0* 8.3*    Cardiac Enzymes No results for input(s): TROPONINI in the last 168 hours.  Microbiology Results  @[email or web address] @  RADIOLOGY:  Dg Chest Portable 1 View  Result Date: 01/23/2017 CLINICAL DATA:  Fever and  cough for 2-3 days EXAM: PORTABLE CHEST 1 VIEW COMPARISON:  Portable chest x-ray of 11/26/2010 FINDINGS: No active infiltrate or effusion is seen. Mediastinal and hilar contours are unremarkable. The heart is borderline enlarged and stable. No acute bony abnormality is seen. IMPRESSION: No active disease. Electronically Signed   By: Dwyane DeePaul  Barry M.D.   On: 01/23/2017 15:50      Management plans discussed with the patient and she is in agreement. Stable for discharge SNF  Patient should follow up with pcp  CODE STATUS:     Code Status Orders        Start     Ordered   01/23/17 1616  Do not attempt resuscitation (DNR)  Continuous    Question Answer Comment  In the event of cardiac or respiratory ARREST Do not call a "code blue"  In the event of cardiac or respiratory ARREST Do not perform Intubation, CPR, defibrillation or ACLS   In the event of cardiac or respiratory ARREST Use medication by any route, position, wound care, and other measures to relive pain and suffering. May use oxygen, suction and manual treatment of airway obstruction as needed for comfort.   Comments Nurse may pronounce      01/23/17 1616    Code Status History    Date Active Date Inactive Code Status Order ID Comments User Context   This patient has a current code status but no historical code status.    Advance Directive Documentation   Flowsheet Row Most Recent Value  Type of Advance Directive  Out of facility DNR (pink MOST or yellow form)  Pre-existing out of facility DNR order (yellow form or pink MOST form)  Yellow form placed in chart (order not valid for inpatient use)  "MOST" Form in Place?  No data      TOTAL TIME TAKING CARE OF THIS PATIENT: 38 minutes.    Note: This dictation was prepared with Dragon dictation along with smaller phrase technology. Any transcriptional errors that result from this process are unintentional.  Damiah Mcdonald M.D on 01/25/2017 at 8:31 AM  Between 7am to 6pm -  Pager - [phone] After 6pm go to [email or web address] - Social research officer, government  [facility] [place] [facility]  Office  [phone]  CC: Primary care physician; Lynnea Ferrier, MD

## 2017-01-25 NOTE — Clinical Social Work Placement (Signed)
   CLINICAL SOCIAL WORK PLACEMENT  NOTE  Date:  01/25/2017  Patient Details  Name: Gina Hampton MRN: [id number] Date of Birth: 08-20-1936  Clinical Social Work is seeking post-discharge placement for this patient at the Skilled  Nursing Facility level of care (*CSW will initial, date and re-position this form in  chart as items are completed):  Yes   Patient/family provided with [place] Clinical Social Work Department's list of facilities offering this level of care within the geographic area requested by the patient (or if unable, by the patient's family).  Yes   Patient/family informed of their freedom to choose among providers that offer the needed level of care, that participate in Medicare, Medicaid or managed care program needed by the patient, have an available bed and are willing to accept the patient.  Yes   Patient/family informed of [facility] ownership interest [facility] and [facility], as well as of the fact that they are under no obligation to receive care at these facilities.  PASRR submitted to EDS on       PASRR number received on       Existing PASRR number confirmed on 01/24/17     FL2 transmitted to all facilities in geographic area requested by pt/family on 01/24/17     FL2 transmitted to all facilities within larger geographic area on       Patient informed that his/her managed care company has contracts with or will negotiate with certain facilities, including the following:        Yes   Patient/family informed of bed offers received.  Patient chooses bed at  [facility] )     Physician recommends and patient chooses bed at      Patient to be transferred to  [facility] ) on 01/25/17.  Patient to be transferred to facility by  [facility] )     Patient family notified on 01/25/17 of transfer.  Name of family member notified:   (Patient's sister [name] is aware of D/C today. )     PHYSICIAN       Additional  Comment:    _______________________________________________ Raysa Bosak, Darleen CrockerBailey M, LCSW 01/25/2017, 12:24 PM

## 2017-01-26 LAB — CULTURE, BLOOD (ROUTINE X 2)

## 2017-01-28 LAB — CULTURE, BLOOD (ROUTINE X 2): Culture: NO GROWTH

## 2017-01-29 LAB — CULTURE, BLOOD (ROUTINE X 2)
CULTURE: NO GROWTH
Culture: NO GROWTH

## 2018-01-14 ENCOUNTER — Other Ambulatory Visit: Payer: Self-pay | Admitting: Internal Medicine

## 2018-01-14 DIAGNOSIS — R569 Unspecified convulsions: Secondary | ICD-10-CM

## 2018-01-15 ENCOUNTER — Other Ambulatory Visit: Payer: Self-pay | Admitting: Nurse Practitioner

## 2018-01-15 DIAGNOSIS — R569 Unspecified convulsions: Secondary | ICD-10-CM

## 2018-01-17 ENCOUNTER — Ambulatory Visit: Payer: Medicare (Managed Care) | Attending: Nurse Practitioner

## 2018-01-17 DIAGNOSIS — R569 Unspecified convulsions: Secondary | ICD-10-CM | POA: Diagnosis not present

## 2018-01-17 DIAGNOSIS — G309 Alzheimer's disease, unspecified: Secondary | ICD-10-CM | POA: Insufficient documentation

## 2018-01-17 DIAGNOSIS — F0280 Dementia in other diseases classified elsewhere without behavioral disturbance: Secondary | ICD-10-CM | POA: Diagnosis not present

## 2018-05-17 IMAGING — CT CT HEAD W/O CM
4 series · 17 of 47 positions shown, 19 images · non-contrast
Comparison: 04/22/2011

CLINICAL DATA: Fall.

EXAM:
CT HEAD WITHOUT CONTRAST
TECHNIQUE: Contiguous axial images were obtained from the base of the skull
through the vertex without intravenous contrast.

[Series 2: head wo · axial · 0.39mm/px · z∈[+509,+614]mm · 7 of 29 slices shown, 9 images]
[im 4/29  brain]
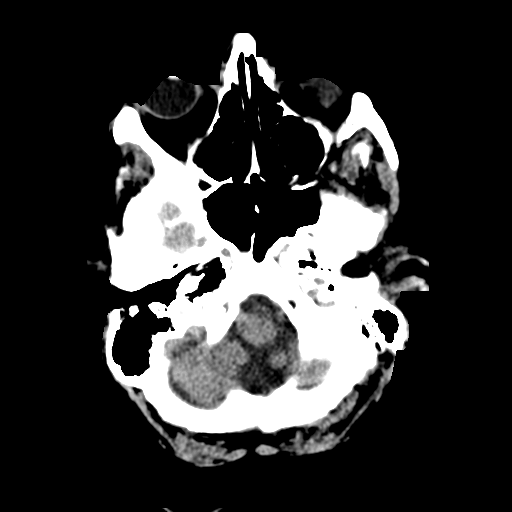
[im 4/29  bone]
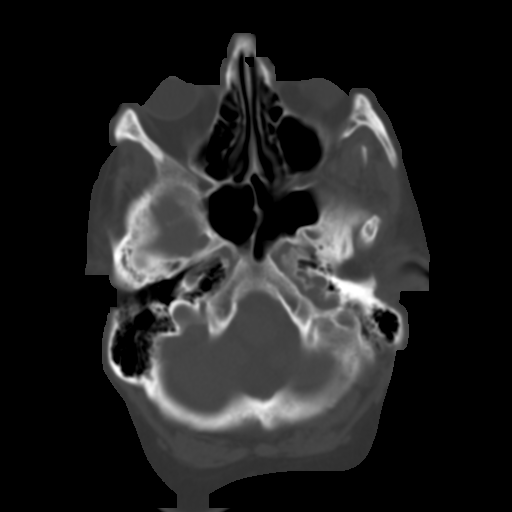
[im 8/29  brain]
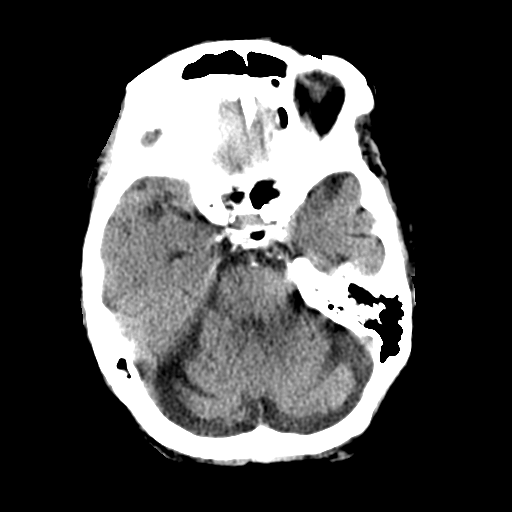
[im 11/29  brain]
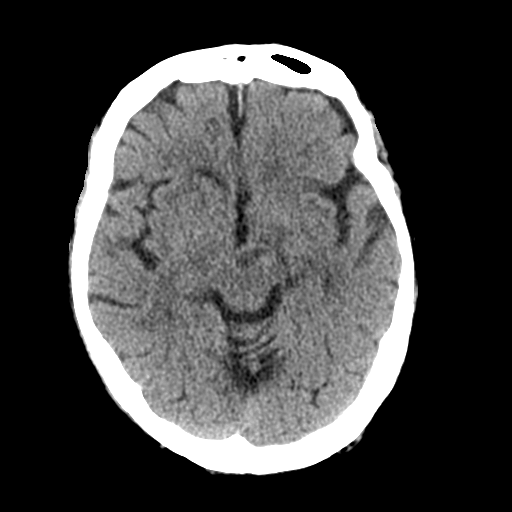
[im 15/29  brain]
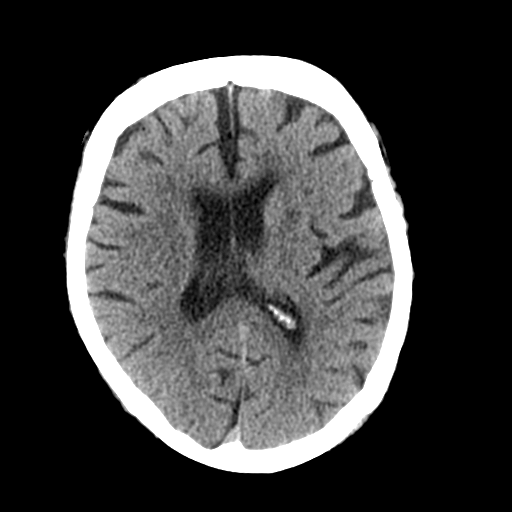
[im 18/29  brain]
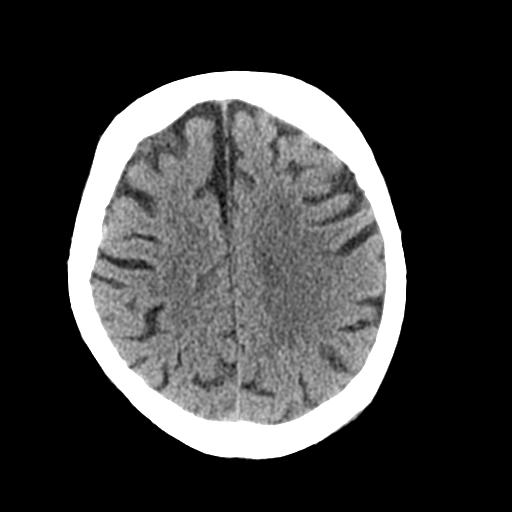
[im 18/29  bone]
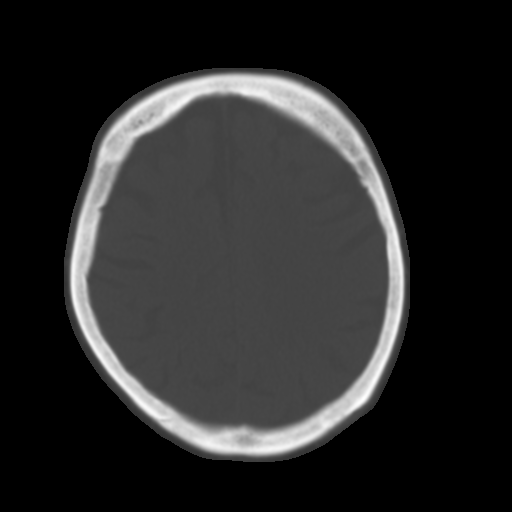
[im 22/29  brain]
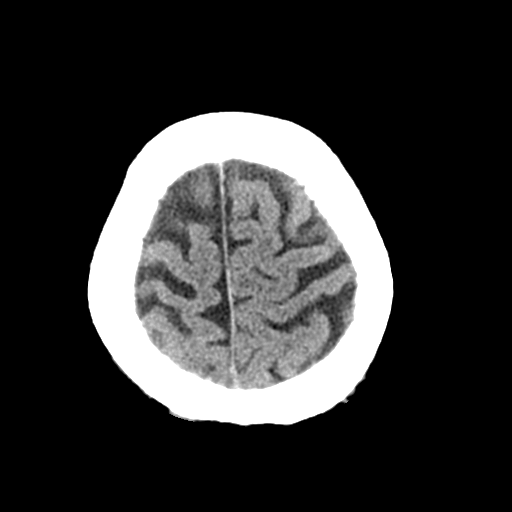
[im 25/29  brain]
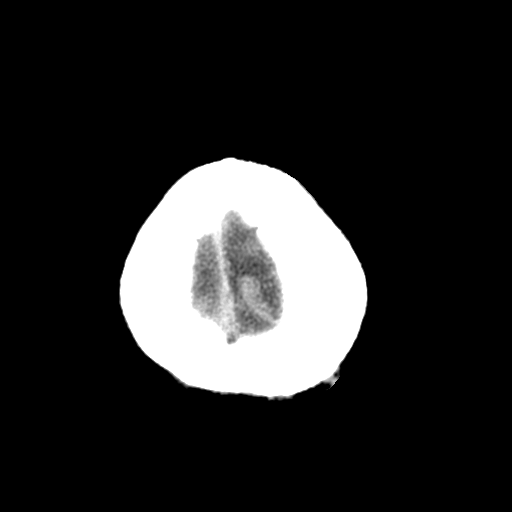

[Series 3: head bone · axial · 0.39mm/px · z∈[+508,+558]mm · 4 of 73 slices shown]
[im 8/73  bone]
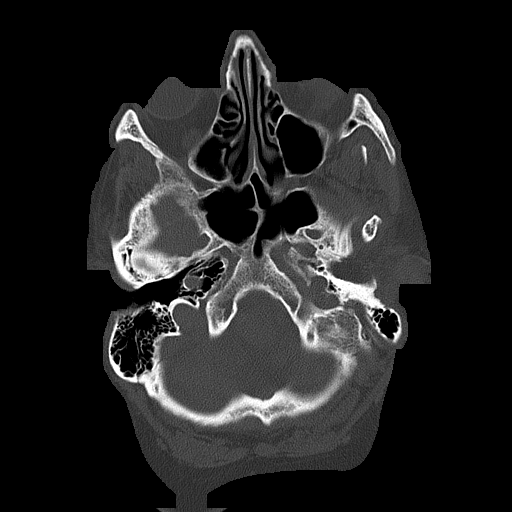
[im 15/73  bone]
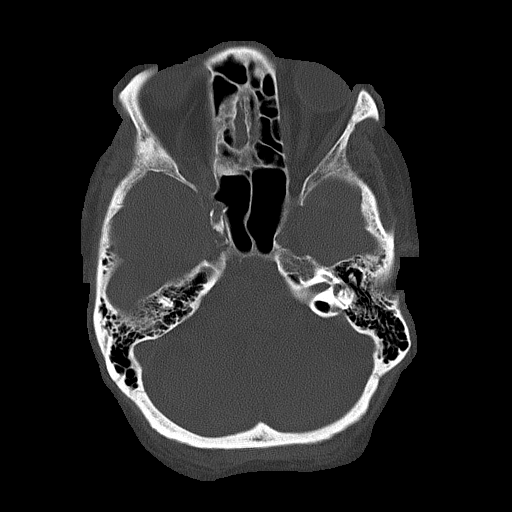
[im 22/73  bone]
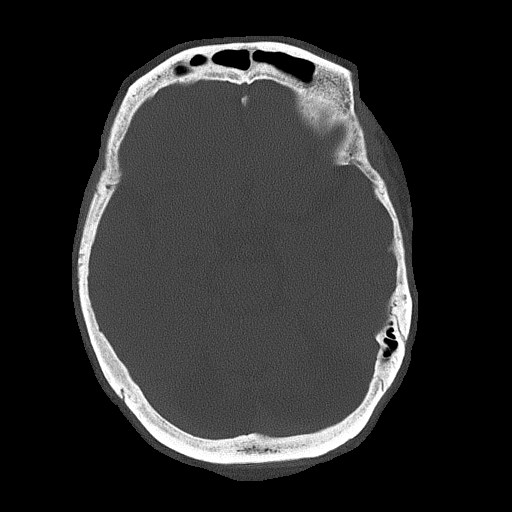
[im 33/73  bone]
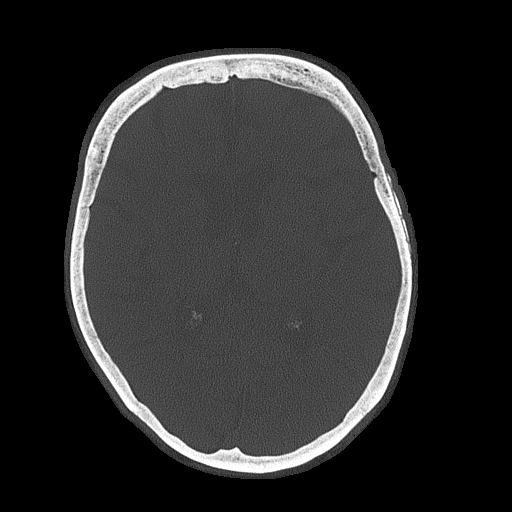

[Series 4: coronal soft tissue · coronal · 0.30mm/px · 3 of 63 slices shown]
[im 21/63  brain]
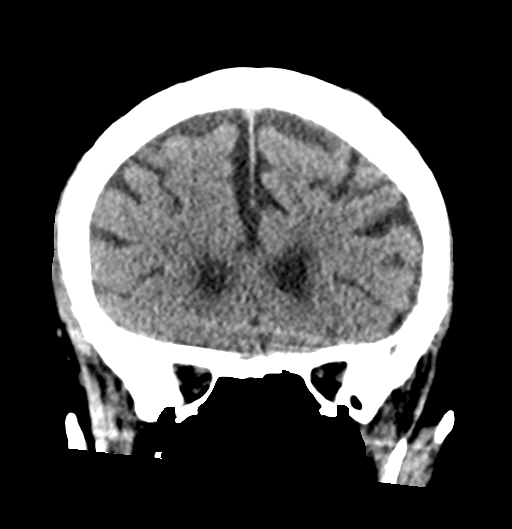
[im 28/63  brain]
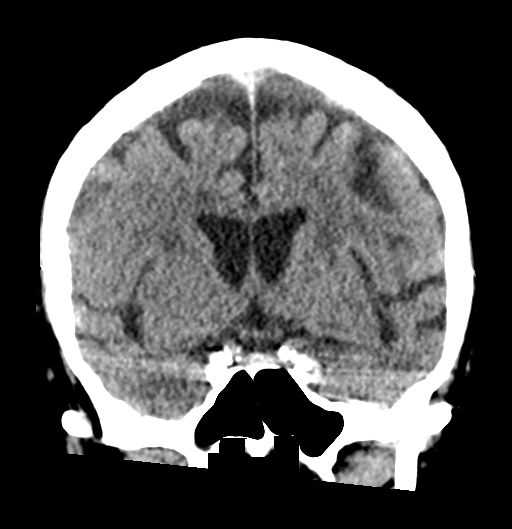
[im 35/63  brain]
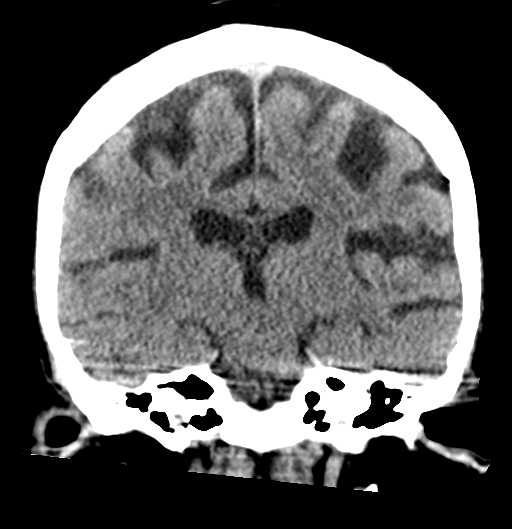

[Series 5: sagittal soft tissue · sagittal · 0.30mm/px · 3 of 50 slices shown]
[im 17/50  brain]
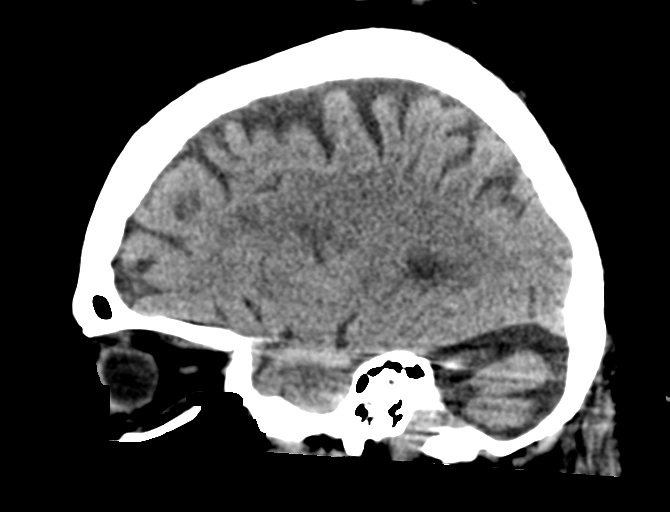
[im 25/50  brain]
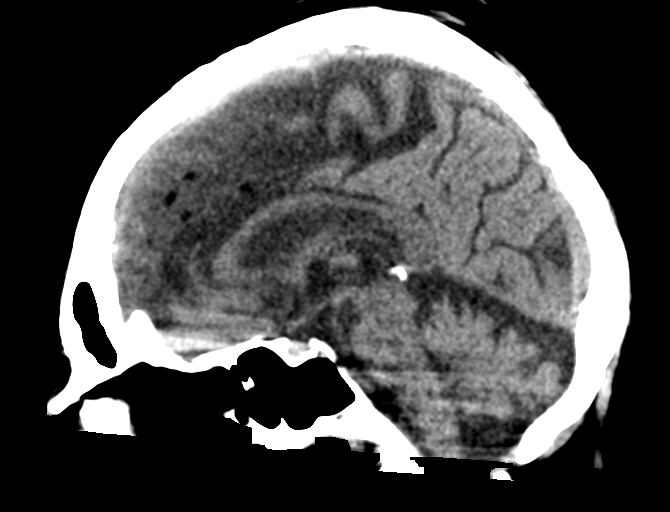
[im 33/50  brain]
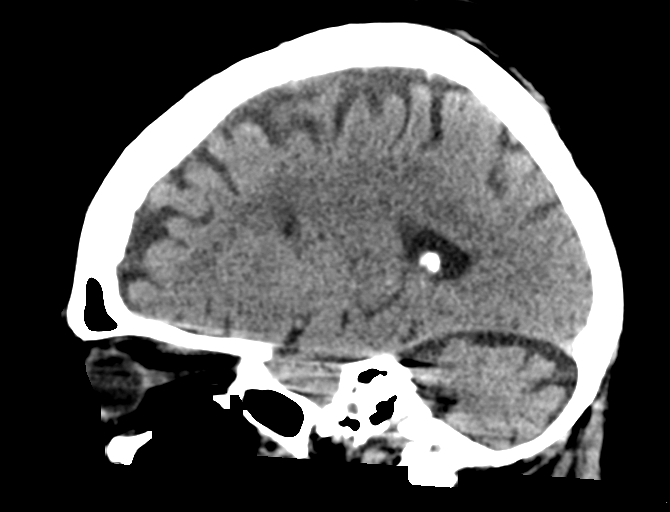

[17 of 47 positions shown; findings below may reference images not displayed]

FINDINGS: Brain: There is prominence of the sulci and ventricles compatible
with brain atrophy. Patchy areas of low attenuation within the
subcortical and periventricular white matter noted compatible with
chronic microvascular disease. There is no abnormal extra-axial
fluid collection, intracranial hemorrhage or mass. No evidence for
acute infarction, hemorrhage, hydrocephalus.

Vascular: No hyperdense vessel or unexpected calcification.

Skull: Normal. Negative for fracture or focal lesion.

Sinuses/Orbits: The paranasal sinuses and the mastoid air cells are
clear. The calvarium appears intact.

Other: None.
IMPRESSION: 1. No acute intracranial abnormalities.
2. Chronic small vessel ischemic change and brain atrophy.

## 2020-04-25 ENCOUNTER — Other Ambulatory Visit: Payer: Self-pay

## 2020-04-25 ENCOUNTER — Encounter (HOSPITAL_COMMUNITY): Payer: Self-pay | Admitting: Emergency Medicine

## 2020-04-25 ENCOUNTER — Emergency Department (HOSPITAL_COMMUNITY): Payer: Medicare (Managed Care)

## 2020-04-25 ENCOUNTER — Inpatient Hospital Stay (HOSPITAL_COMMUNITY)
Admission: EM | Admit: 2020-04-25 | Discharge: 2020-04-27 | DRG: 065 | Disposition: A | Discharge status: Hospice | Payer: Medicare (Managed Care) | Source: Skilled Nursing Facility | Attending: Internal Medicine | Admitting: Internal Medicine

## 2020-04-25 DIAGNOSIS — G8191 Hemiplegia, unspecified affecting right dominant side: Secondary | ICD-10-CM | POA: Diagnosis present

## 2020-04-25 DIAGNOSIS — Z79899 Other long term (current) drug therapy: Secondary | ICD-10-CM

## 2020-04-25 DIAGNOSIS — I4891 Unspecified atrial fibrillation: Secondary | ICD-10-CM | POA: Diagnosis present

## 2020-04-25 DIAGNOSIS — F0391 Unspecified dementia with behavioral disturbance: Secondary | ICD-10-CM | POA: Diagnosis present

## 2020-04-25 DIAGNOSIS — R4701 Aphasia: Secondary | ICD-10-CM | POA: Diagnosis present

## 2020-04-25 DIAGNOSIS — Z20822 Contact with and (suspected) exposure to covid-19: Secondary | ICD-10-CM | POA: Diagnosis present

## 2020-04-25 DIAGNOSIS — Z7189 Other specified counseling: Secondary | ICD-10-CM

## 2020-04-25 DIAGNOSIS — Z993 Dependence on wheelchair: Secondary | ICD-10-CM | POA: Diagnosis not present

## 2020-04-25 DIAGNOSIS — I471 Supraventricular tachycardia: Secondary | ICD-10-CM | POA: Diagnosis present

## 2020-04-25 DIAGNOSIS — I63512 Cerebral infarction due to unspecified occlusion or stenosis of left middle cerebral artery: Principal | ICD-10-CM | POA: Diagnosis present

## 2020-04-25 DIAGNOSIS — G40909 Epilepsy, unspecified, not intractable, without status epilepticus: Secondary | ICD-10-CM | POA: Diagnosis present

## 2020-04-25 DIAGNOSIS — R008 Other abnormalities of heart beat: Secondary | ICD-10-CM | POA: Diagnosis not present

## 2020-04-25 DIAGNOSIS — R1310 Dysphagia, unspecified: Secondary | ICD-10-CM | POA: Diagnosis present

## 2020-04-25 DIAGNOSIS — Z515 Encounter for palliative care: Secondary | ICD-10-CM

## 2020-04-25 DIAGNOSIS — Z66 Do not resuscitate: Secondary | ICD-10-CM | POA: Diagnosis present

## 2020-04-25 DIAGNOSIS — F0390 Unspecified dementia without behavioral disturbance: Secondary | ICD-10-CM | POA: Diagnosis not present

## 2020-04-25 DIAGNOSIS — I10 Essential (primary) hypertension: Secondary | ICD-10-CM | POA: Diagnosis present

## 2020-04-25 DIAGNOSIS — R29724 NIHSS score 24: Secondary | ICD-10-CM | POA: Diagnosis present

## 2020-04-25 DIAGNOSIS — I639 Cerebral infarction, unspecified: Secondary | ICD-10-CM

## 2020-04-25 DIAGNOSIS — F03918 Unspecified dementia, unspecified severity, with other behavioral disturbance: Secondary | ICD-10-CM | POA: Diagnosis present

## 2020-04-25 LAB — CBC
HCT: 37.1 % (ref 36.0–46.0)
Hemoglobin: 12.8 g/dL (ref 12.0–15.0)
MCH: 29.4 pg (ref 26.0–34.0)
MCHC: 34.5 g/dL (ref 30.0–36.0)
MCV: 85.3 fL (ref 80.0–100.0)
Platelets: 198 10*3/uL (ref 150–400)
RBC: 4.35 MIL/uL (ref 3.87–5.11)
RDW: 12.4 % (ref 11.5–15.5)
WBC: 9.7 10*3/uL (ref 4.0–10.5)
nRBC: 0 % (ref 0.0–0.2)

## 2020-04-25 LAB — COMPREHENSIVE METABOLIC PANEL
ALT: 17 U/L (ref 0–44)
ALT: 18 U/L (ref 0–44)
AST: 36 U/L (ref 15–41)
AST: 53 U/L — ABNORMAL HIGH (ref 15–41)
Albumin: 4.2 g/dL (ref 3.5–5.0)
Albumin: 4.2 g/dL (ref 3.5–5.0)
Alkaline Phosphatase: 92 U/L (ref 38–126)
Alkaline Phosphatase: 93 U/L (ref 38–126)
Anion gap: 13 (ref 5–15)
Anion gap: 12 (ref 5–15)
BUN: 14 mg/dL (ref 8–23)
BUN: 13 mg/dL (ref 8–23)
CO2: 25 mmol/L (ref 22–32)
CO2: 27 mmol/L (ref 22–32)
Calcium: 9.4 mg/dL (ref 8.9–10.3)
Calcium: 9.6 mg/dL (ref 8.9–10.3)
Chloride: 99 mmol/L (ref 98–111)
Chloride: 98 mmol/L (ref 98–111)
Creatinine, Ser: 0.81 mg/dL (ref 0.44–1.00)
Creatinine, Ser: 0.71 mg/dL (ref 0.44–1.00)
GFR calc Af Amer: >60 mL/min (ref >60)
GFR calc Af Amer: >60 mL/min (ref >60)
GFR calc non Af Amer: >60 mL/min (ref >60)
GFR calc non Af Amer: >60 mL/min (ref >60)
Glucose, Bld: 127 mg/dL — ABNORMAL HIGH (ref 70–99)
Glucose, Bld: 122 mg/dL — ABNORMAL HIGH (ref 70–99)
Potassium: 4.1 mmol/L (ref 3.5–5.1)
Potassium: 4.7 mmol/L (ref 3.5–5.1)
Sodium: 137 mmol/L (ref 135–145)
Sodium: 137 mmol/L (ref 135–145)
Total Bilirubin: 0.7 mg/dL (ref 0.3–1.2)
Total Bilirubin: 1.2 mg/dL (ref 0.3–1.2)
Total Protein: 7 g/dL (ref 6.5–8.1)
Total Protein: 6.6 g/dL (ref 6.5–8.1)

## 2020-04-25 LAB — DIFFERENTIAL
Abs Immature Granulocytes: 0.03 10*3/uL (ref 0.00–0.07)
Basophils Absolute: 0 10*3/uL (ref 0.0–0.1)
Basophils Relative: 0 %
Eosinophils Absolute: 0 10*3/uL (ref 0.0–0.5)
Eosinophils Relative: 0 %
Immature Granulocytes: 0 %
Lymphocytes Relative: 10 %
Lymphs Abs: 1 10*3/uL (ref 0.7–4.0)
Monocytes Absolute: 0.6 10*3/uL (ref 0.1–1.0)
Monocytes Relative: 6 %
Neutro Abs: 8.2 10*3/uL — ABNORMAL HIGH (ref 1.7–7.7)
Neutrophils Relative %: 84 %

## 2020-04-25 LAB — PROTIME-INR
INR: 1.1 (ref 0.8–1.2)
Prothrombin Time: 13.8 seconds (ref 11.4–15.2)

## 2020-04-25 LAB — I-STAT CHEM 8, ED
BUN: 19 mg/dL (ref 8–23)
Calcium, Ion: 0.98 mmol/L — ABNORMAL LOW (ref 1.15–1.40)
Chloride: 103 mmol/L (ref 98–111)
Creatinine, Ser: 0.6 mg/dL (ref 0.44–1.00)
Glucose, Bld: 123 mg/dL — ABNORMAL HIGH (ref 70–99)
HCT: 38 % (ref 36.0–46.0)
Hemoglobin: 12.9 g/dL (ref 12.0–15.0)
Potassium: 5.2 mmol/L — ABNORMAL HIGH (ref 3.5–5.1)
Sodium: 136 mmol/L (ref 135–145)
TCO2: 28 mmol/L (ref 22–32)

## 2020-04-25 LAB — APTT: aPTT: 32 seconds (ref 24–36)

## 2020-04-25 LAB — CBG MONITORING, ED: Glucose-Capillary: 114 mg/dL — ABNORMAL HIGH (ref 70–99)

## 2020-04-25 MED ORDER — SENNOSIDES-DOCUSATE SODIUM 8.6-50 MG PO TABS: 1.0000 | ORAL_TABLET | Freq: Every evening | ORAL | Status: DC | PRN

## 2020-04-25 MED ORDER — PROMETHAZINE HCL 25 MG PO TABS: 12.5000 mg | ORAL_TABLET | Freq: Four times a day (QID) | ORAL | Status: DC | PRN

## 2020-04-25 MED ORDER — CARBAMAZEPINE 200 MG PO TABS: 200.0000 mg | ORAL_TABLET | Freq: Two times a day (BID) | ORAL | Status: DC

## 2020-04-25 MED ORDER — DONEPEZIL HCL 10 MG PO TABS: 10.0000 mg | ORAL_TABLET | Freq: Every day | ORAL | Status: DC

## 2020-04-25 MED ORDER — PANTOPRAZOLE SODIUM 20 MG PO TBEC: 20.0000 mg | DELAYED_RELEASE_TABLET | Freq: Every day | ORAL | Status: DC

## 2020-04-25 MED ORDER — ACETAMINOPHEN 325 MG PO TABS: 650.0000 mg | ORAL_TABLET | Freq: Four times a day (QID) | ORAL | Status: DC | PRN

## 2020-04-25 MED ORDER — GABAPENTIN 100 MG PO CAPS: 300.0000 mg | ORAL_CAPSULE | Freq: Two times a day (BID) | ORAL | Status: DC

## 2020-04-25 MED ORDER — LEVETIRACETAM IN NACL 1000 MG/100ML IV SOLN
1000.0000 mg | Freq: Two times a day (BID) | INTRAVENOUS | Status: DC
  Administered 2020-04-25 – 2020-04-27 (×5): 1000 mg via INTRAVENOUS
  Filled 2020-04-25 (×5): qty 100

## 2020-04-25 MED ORDER — ATORVASTATIN CALCIUM 10 MG PO TABS: 20.0000 mg | ORAL_TABLET | Freq: Every day | ORAL | Status: DC

## 2020-04-25 MED ORDER — SODIUM CHLORIDE 0.9% FLUSH: 3.0000 mL | Freq: Once | INTRAVENOUS | Status: DC

## 2020-04-25 MED ORDER — IPRATROPIUM-ALBUTEROL 0.5-2.5 (3) MG/3ML IN SOLN: 3.0000 mL | Freq: Four times a day (QID) | RESPIRATORY_TRACT | Status: DC | PRN

## 2020-04-25 MED ORDER — CITALOPRAM HYDROBROMIDE 10 MG PO TABS: 20.0000 mg | ORAL_TABLET | Freq: Every day | ORAL | Status: DC

## 2020-04-25 MED ORDER — ACETAMINOPHEN 650 MG RE SUPP: 650.0000 mg | Freq: Four times a day (QID) | RECTAL | Status: DC | PRN

## 2020-04-25 MED ORDER — LEVETIRACETAM 500 MG PO TABS: 500.0000 mg | ORAL_TABLET | Freq: Two times a day (BID) | ORAL | Status: DC

## 2020-04-25 NOTE — ED Notes (Signed)
[phone] Britta Mccreedy -sister please call with updates.

## 2020-04-25 NOTE — ED Triage Notes (Signed)
Pt from [facility] for stroke like sx. Staff reported LKW 0800 this AM. Was able to take all meds and urinated per her usual. Staff reported a possible seizure this morning with postictal sx. DNR, DNI, palliative & hospice care

## 2020-04-25 NOTE — Progress Notes (Signed)
Pharmacy communication note:  Home Keppra changed from PO to IV per MD consult. Discussed with MD that carbamazepine does not have an IV equivalent. Will hold for now.   Vinnie Level, PharmD., BCPS, BCCCP Clinical Pharmacist

## 2020-04-25 NOTE — Progress Notes (Signed)
Patient arrived to unit on stretcher from ED. Patient noted with saturated diaper and damp gown. Patient non-verbal. Will squeeze with left hand. Blood pressure and HR elevated. Dr. Darl Pikes paged and notified. No orders for any blood pressure medications. Safety maintained, continue ongoing monitoring and plan of care.

## 2020-04-25 NOTE — H&P (Addendum)
Date: 04/25/2020               Patient Name:  Gina Hampton MRN: [id number]  DOB: 08-08-1936 Age / Sex: 84 y.o., female   PCP: Adin Hector, MD              Medical Service: Internal Medicine Teaching Service              Attending Physician: Dr. Sid Falcon, MD    First Contact: Nanetta Batty, MS3 Pager: [phone]  Second Contact: Dr. Dawayne Cirri Pager: [phone]  Third Contact Dr. Gilberto Better Pager: [phone]       After Hours (After 5p/  First Contact Pager: [phone]  weekends / holidays): Second Contact Pager: [phone]   Chief Complaint: Aphasia, right-sided weakness  History of Present Illness: Gina Hampton is a 84 yo female with a past medical history significant for seizures, hypertension, and dementia who presented to the emergency department as a code stroke. Patient lives at a skilled nursing facility who reported she was last known normal at 8 am. Patient is nonverbal, past medical history provided by sister (HPOA) who is currently at bedside. The patient has no known history of arrhythmias, diabetes, stroke, clotting disorders, or tobacco use  Patient's last known normal was reported by the nursing facility at 8 am. At around 12 pm, the nursing staff noted that the patient was nonverbal and not following commands and called EMS. Staff noted patient had right-sided weakness and a leftward gaze. Staff at the nursing facility stated that she may have had a possible seizure this morning.  On arrival to the emergency department, patient had a NIH stroke scale score of 24. CT imaging of head was obtained indicating left MCA stroke. It was determined that the patient was not a good candidate for TPA given she was outside the time window.  Patient has a history of seizures that began when she was 84 yo. According to the patient's sister, the seizures had been well controlled with medication, but the sister has not been aware of patient's care over the past 2  years.  Patient has a DNR order. Patients sister expressed that the patient would not want extensive treatment. The sister is open to palliative/hospice consultation.   Meds:  - Ativan: 2 mg/mL, intramuscular injection; PRN - Clonidine HCL: 0.1 mg, oral; 2 tablets twice daily - Namenda: 5 mg, oral; 1 tablet once daily - Seroquel: 25 mg, oral; 25mg  twice daily - Citalopram Hydrobromide: 20 mg, oral; 1 tablet once a day - Donepezil HCL: 10 mg, otal; 1 tablet once daily at bedtime - Tegretolt: 200 mg, oral; once daily in the evening - Tegretol: 250 mg, oral; 1.5 tablets once daily in the morning - Gabapentin: 300 mg, oral; 2 capsules once daily at bedtime - Atorvastatin Calcium: 40 mg, oral; 1 tablet once daily at bedtime - Vitamin D3: 2000 unit, oral; 1 tablet once daily - Keppra: 1000 mg, oral; 1 tablet twice daily - Lisinopril: 40 mg, oral; 1 tablet once a day * Per nursing facility (in chart under images)   Allergies: Allergies as of 04/25/2020  . (No Known Allergies)   Past Medical History:  Diagnosis Date  . Hypertension   . Seizures ([place])     Family History: Patient's mother had cancer.  Social History: Patient lives at a skilled nursing facility. Patient has never used tobacco products and does not drink alcohol.  Review of Systems: A complete ROS was  negative except as per HPI.   Physical Exam: Blood pressure (!) 199/82, pulse 98, temperature 99.8 F (37.7 C), temperature source Rectal, resp. rate 17, height 5\' 2"  (1.575 m), weight 63.5 kg, SpO2 97 %.  General: Nonverbal, unresponsive, and in no apparent distress. Head: Normocephalic, atraumatic. Eyes: Pupils reactive to light. Corneal reflex appreciated. Rhythmic horizontal gaze noted. Neurologic: Awake, unresponsive. Withdraws to noxious stimuli bilaterally. Normal bulk and tone throughout. Cardiovascular: S1 normal. S2 normal. RRR, 3/6 holosystolic flow murmurs best heard at the upper left sternal border; no  rubs or gallops. Respiratory: Normal breathing effort. Faint lung sounds clear to auscultation. GI: Abdomen soft, non-tender. No distension. Normoactive bowel sounds appreciated. Psych: Mood and affect unable to be assessed at this time.  EKG: personally reviewed my interpretation is normal sinus rhythm.   Assessment & Plan by Problem: Active Problems:   Acute ischemic left MCA stroke (HCC)  Gina Hampton is a 84 yo female with a past medical history significant for seizures, hypertension, and dementia  who presented to the emergency department as a code stroke. CT imaging of the head revealed a larger left MCA stroke.  # Acute left MCA stroke At approximately 12 pm, the patient was discovered by nursing home faculty to be unresponsive and nonverbal, with a right-sided weakness and a leftward eye gaze. Patients last know well was at 8 am. CT imaging revealed left MCA stroke. Patients sister expressed patient would not want any extensive treatment measures. Sister states that she would prefer for patient not to even be on statins. Sister articulates that she previously discussed patient's wishes regarding care with patient. Plan: - Continue to monitor patients vitals, on telemetry - Consult palliative/hospice care. - Continue NPO - Consult speech and language pathologist to assess swallowing function - Could pursue extensive stroke workup, including MRI of brain, A1C and lipid panel, echocardiogram, and vascular imaging. However, patients family has expressed desire for supportive/comfort care -Consult pastoral care  # Hypertension Patient's chronic hypertension has been controlled with lisinopril and clonidine outpatient.  Plan: -Patient's hypertension will be monitored without her outpatient medication regimen for permissive hypertension purposes and patient's NPO status.  # Seizure disorder Patient has a chronic history of seizures that has been well controlled with outpatient  medication with Keppra and tegretol. - Will convert Keppra to IV medication per pharmacy consult for seizure control. Tegretol not available in IV formulation.  # Dementia Patient has a history a dementia for which she takes namenda, seroquel, and Donepezil. Will hold these medications in setting of inability to take PO  DVT Ppx: SCDs Diet: NPO, hold oral medications Dispo: Admit patient to Inpatient with expected length of stay greater than 2 midnights.  Signed: 97, Medical Student 04/25/2020, 4:28 PM   Attestation for Student Documentation:  I personally was present and performed or re-performed the history, physical exam and medical decision-making activities of this service and have verified that the service and findings are accurately documented in the student's note.  04/27/2020, MD 04/25/2020, 6:58 PM

## 2020-04-25 NOTE — Code Documentation (Signed)
Patient from [facility] where she is baseline relatively independent (walking, talking, feeding). She was LKW at 0800 per facility. Staff found her with a left gaze, right sided weakness, nonverbal, and not following commands. [facility] arrived, called Code Stroke and brought patient to [facility]. Patient taken to CT with stroke team at bedside. Per MD CT WO contrast negative for bleed but showing large L MCA infarct. NIHSS 24. See Documentation. Pt's sister contacted and updated. She stated pt would not want aggressive care. Pt is a DNR/DNI per family and facility. Paperwork in chart. Pt not a candidate for TPA d/t outside window. Care plan: Palliative Care and supportive treatment. Family is now at bedside. Handoff given to RN Alycia Rossetti and Selena Batten. Gina Hampton, [place] Scrape, RN

## 2020-04-25 NOTE — Consult Note (Signed)
Requesting Physician: Dr. Hoyt Koch    Chief Complaint: Aphasia, right-sided weakness   History obtained from: Patient and Chart  HPI:                                                                                                                                       Gina Hampton is a 84 y.o. female with past medical history of dementia who lives in a skilled nursing facility, hypertension, seizure who was apparently last known normal at 8 am presents to the emergency department as a code stroke with left gaze deviation and right-sided weakness, nonverbal and not following any commands.  Last known normal reported by nursing facility at 8 AM.  Around 12 PM, nursing staff noted patient was nonverbal and called EMS.  Blood pressure was 200 systolic.  Blood sugar was 123.  On arrival, patient had forced left gaze with a right hemiparesis.  NIH stroke scale 24.  Stat CT head was obtained-showed hyperdense left MCA with already large left MCA stroke.  Aspects was 4.   Not a candidate for TPA as patient outside window. Not a candidate for thrombectomy.  At baseline, patient has mild dementia and is mostly wheelchair-bound.  She apparently is usually alert and oriented, has been full conversations, feeds herself and uses the bathroom.  Date last known well: 4.26 1021 Time last known well: 8 am?  tPA Given: No, outside window NIHSS: 24 Baseline MRS 3   Past Medical History:  Diagnosis Date  . Hypertension   . Seizures (HCC)     Past Surgical History:  Procedure Laterality Date  . NO PAST SURGERIES      Family History  Problem Relation Age of Onset  . Cancer Mother    Social History:  reports that she has never smoked. She has never used smokeless tobacco. She reports that she does not drink alcohol or use drugs.  Allergies: No Known Allergies  Medications:                                                                                                                        I  reviewed home medications   ROS:  14 systems reviewed and negative except above   Examination:                                                                                                      General: Appears well-developed  Psych: Affect appropriate to situation Eyes: No scleral injection HENT: No OP obstrucion Head: Normocephalic.  Cardiovascular: Normal rate and regular rhythm.  Respiratory: Effort normal and breath sounds normal to anterior ascultation GI: Soft.  No distension. There is no tenderness.  Skin: WDI    Neurological Examination Mental Status: Awake, but unresponsive.  Does not follow any commands or verbalize Cranial Nerves: II: Visual fields: Right homonymous hemianopsia III,IV, VI: ptosis not present, left gaze deviation pupils equal, round, reactive to light and accommodation V,VII: smile symmetric, facial light touch sensation normal bilaterally VIII: hearing normal bilaterally IX,X: uvula rises symmetrically XI: bilateral shoulder shrug XII: midline tongue extension Motor: Right : Upper extremity   1/5    Left:     Upper extremity   4/5  Lower extremity   2/5     Lower extremity   3/5 Tone and bulk:normal tone throughout; no atrophy noted Sensory: Withdraws to noxious stimulus bilaterally  Plantars: Right: downgoing   Left: downgoing Cerebellar: Unable to assess due to aphasia      Lab Results: Basic Metabolic Panel: Recent Labs  Lab 04/25/20 1318 04/25/20 1321  NA 137 136  K 4.1 5.2*  CL 99 103  CO2 25  --   GLUCOSE 127* 123*  BUN 14 19  CREATININE 0.81 0.60  CALCIUM 9.4  --     CBC: Recent Labs  Lab 04/25/20 1321  HGB 12.9  HCT 38.0    Coagulation Studies: No results for input(s): LABPROT, INR in the last 72 hours.  Imaging: CT HEAD CODE STROKE WO CONTRAST  Result Date: 04/25/2020 CLINICAL  DATA:  Code stroke.  Right-sided weakness. EXAM: CT HEAD WITHOUT CONTRAST TECHNIQUE: Contiguous axial images were obtained from the base of the skull through the vertex without intravenous contrast. COMPARISON:  CT head 01/01/2017 FINDINGS: Brain: Large area of hypodensity in the left MCA territory compatible with acute infarct. No associated hemorrhage. There is low-density in the insular cortex. There appears to be sparing of the putamen and internal capsule. Hypodensity is present in the left frontal and parietal lobe. Generalized atrophy without hydrocephalus. Chronic infarct right basal ganglia and right thalamus. Vascular: Hyperdense left MCA compatible with acute thrombus. Skull: Negative Sinuses/Orbits: Mild mucosal edema paranasal sinuses. Bilateral cataract extraction. Other: None ASPECTS (Alberta Stroke Program Early CT Score) - Ganglionic level infarction (caudate, lentiform nuclei, internal capsule, insula, M1-M3 cortex): 4 - Supraganglionic infarction (M4-M6 cortex): 0 Total score (0-10 with 10 being normal): 4 IMPRESSION: 1. Large acute infarct left MCA territory.  Negative for hemorrhage. 2. Hyperdense left MCA 3. ASPECTS is 4 4. These results were called by telephone at the time of interpretation on 04/25/2020 at 1:21 pm to provider [facility] , who verbally acknowledged these results. Electronically Signed   By: Marlan Palau M.D.   On: 04/25/2020  13:23     ASSESSMENT AND PLAN  84 y.o. female with past medical history of dementia who lives in a skilled nursing facility, hypertension, seizure who was apparently last known normal at 8 am presents to the emergency department as a code stroke with left gaze deviation and right-sided weakness, nonverbal and not following any commands.  CTA shows large left MCA stroke with large hypodensity, suspect her last known normal was likely before 8 AM vs being a fast progresser.  Aspects score 4-not a candidate for thrombectomy.  This will be the  fairly disabling stroke, discussed with daughter who felt focus should be on comfort.   Recommendations Palliative care/hospice consult If family wants full work-up, can get MRI brain, echocardiogram, telemetry to monitor for paroxysmal A. Fib, vascular imaging like CTA or MRA/carotid Dopplers.  Also A1c and lipid profile. Permissive hypertension up to 161 systolic N.P.O -unless these are comfort feeds  Stroke team will be available as needed.   Caileb Rhue [facility] Pager Number [phone]

## 2020-04-26 DIAGNOSIS — F0390 Unspecified dementia without behavioral disturbance: Secondary | ICD-10-CM

## 2020-04-26 DIAGNOSIS — Z515 Encounter for palliative care: Secondary | ICD-10-CM

## 2020-04-26 DIAGNOSIS — F03918 Unspecified dementia, unspecified severity, with other behavioral disturbance: Secondary | ICD-10-CM | POA: Diagnosis present

## 2020-04-26 DIAGNOSIS — R1310 Dysphagia, unspecified: Secondary | ICD-10-CM

## 2020-04-26 DIAGNOSIS — Z7189 Other specified counseling: Secondary | ICD-10-CM

## 2020-04-26 DIAGNOSIS — I10 Essential (primary) hypertension: Secondary | ICD-10-CM

## 2020-04-26 DIAGNOSIS — Z66 Do not resuscitate: Secondary | ICD-10-CM | POA: Diagnosis present

## 2020-04-26 DIAGNOSIS — I63512 Cerebral infarction due to unspecified occlusion or stenosis of left middle cerebral artery: Principal | ICD-10-CM

## 2020-04-26 DIAGNOSIS — F0391 Unspecified dementia with behavioral disturbance: Secondary | ICD-10-CM

## 2020-04-26 DIAGNOSIS — R008 Other abnormalities of heart beat: Secondary | ICD-10-CM

## 2020-04-26 LAB — GLUCOSE, CAPILLARY
Glucose-Capillary: 122 mg/dL — ABNORMAL HIGH (ref 70–99)
Glucose-Capillary: 124 mg/dL — ABNORMAL HIGH (ref 70–99)

## 2020-04-26 MED ORDER — ONDANSETRON 4 MG PO TBDP: 4.0000 mg | ORAL_TABLET | Freq: Four times a day (QID) | ORAL | Status: DC | PRN

## 2020-04-26 MED ORDER — LORAZEPAM 2 MG/ML IJ SOLN: 1.0000 mg | INTRAMUSCULAR | Status: DC | PRN

## 2020-04-26 MED ORDER — MORPHINE SULFATE (CONCENTRATE) 10 MG/0.5ML PO SOLN
5.0000 mg | Freq: Four times a day (QID) | ORAL | Status: DC
  Administered 2020-04-26 – 2020-04-27 (×5): 5 mg via SUBLINGUAL
  Filled 2020-04-26 (×5): qty 0.5

## 2020-04-26 MED ORDER — MORPHINE SULFATE (CONCENTRATE) 10 MG/0.5ML PO SOLN
5.0000 mg | ORAL | Status: DC | PRN
Start: 2020-04-26 — End: 2020-04-26

## 2020-04-26 MED ORDER — GLYCOPYRROLATE 0.2 MG/ML IJ SOLN: 0.2000 mg | INTRAMUSCULAR | Status: DC | PRN

## 2020-04-26 MED ORDER — MORPHINE SULFATE (PF) 2 MG/ML IV SOLN
2.0000 mg | INTRAVENOUS | Status: DC | PRN
  Administered 2020-04-26: 2 mg via INTRAVENOUS
  Filled 2020-04-26: qty 1

## 2020-04-26 MED ORDER — HALOPERIDOL LACTATE 5 MG/ML IJ SOLN: 0.5000 mg | INTRAMUSCULAR | Status: DC | PRN

## 2020-04-26 MED ORDER — ONDANSETRON HCL 4 MG/2ML IJ SOLN: 4.0000 mg | Freq: Four times a day (QID) | INTRAMUSCULAR | Status: DC | PRN

## 2020-04-26 MED ORDER — BIOTENE DRY MOUTH MT LIQD: 15.0000 mL | OROMUCOSAL | Status: DC | PRN

## 2020-04-26 MED ORDER — POLYVINYL ALCOHOL 1.4 % OP SOLN
1.0000 [drp] | Freq: Four times a day (QID) | OPHTHALMIC | Status: DC | PRN
  Filled 2020-04-26: qty 15

## 2020-04-26 MED ORDER — MORPHINE SULFATE (CONCENTRATE) 10 MG/0.5ML PO SOLN: 5.0000 mg | ORAL | Status: DC | PRN

## 2020-04-26 NOTE — Evaluation (Signed)
Clinical/Bedside Swallow Evaluation Patient Details  Name: Gina Hampton MRN: [id number] Date of Birth: 02/12/1936  Today's Date: 04/26/2020 Time: SLP Start Time (ACUTE ONLY): 0944 SLP Stop Time (ACUTE ONLY): 1002 SLP Time Calculation (min) (ACUTE ONLY): 18 min  Past Medical History:  Past Medical History:  Diagnosis Date  . Hypertension   . Seizures (HCC)    Past Surgical History:  Past Surgical History:  Procedure Laterality Date  . NO PAST SURGERIES     HPI:  84 y.o female, resident of [facility], with PMHx of dementia, HTN, seizure presented with left MCA CVA. Palliative Care has been consulted.    Assessment / Plan / Recommendation Clinical Impression  Pt presents with significant dysphagia at this time, marked by inability to seal lips or retain ice chips/trials of water, presence of lingual manipulation, but no observable swallow response (no laryngeal movement palpated).  Pt followed a single command to squeeze hand; did not follow further commands.  Her sister and daughter were at bedside.  Recommend continuing NPO for now; please provide oral care.  Discussed with her sister that our service will return tomorrow to assess for any improvments; there is a Palliative Medicine consult pending.  Will follow.  SLP Visit Diagnosis: Dysphagia, unspecified (R13.10)    Aspiration Risk  Severe aspiration risk    Diet Recommendation   npo      Other  Recommendations Oral Care Recommendations: Oral care QID   Follow up Recommendations        Frequency and Duration min 2x/week  1 week       Prognosis Prognosis for Safe Diet Advancement: Guarded      Swallow Study   General HPI: 84 y.o female, resident of [facility], with PMHx of dementia, HTN, seizure presented with left MCA CVA. Palliative Care has been consulted.  Type of Study: Bedside Swallow Evaluation Previous Swallow Assessment: no Diet Prior to this Study: NPO Temperature Spikes Noted:  No Respiratory Status: Room air History of Recent Intubation: No Behavior/Cognition: Alert Oral Cavity Assessment: Within Functional Limits;Excessive secretions Oral Care Completed by SLP: No Vision: Impaired for self-feeding Self-Feeding Abilities: Total assist Patient Positioning: Upright in bed Baseline Vocal Quality: Not observed Volitional Cough: Cognitively unable to elicit Volitional Swallow: Unable to elicit    Oral/Motor/Sensory Function Overall Oral Motor/Sensory Function: Mild impairment   Ice Chips Ice chips: Impaired Presentation: Spoon Oral Phase Impairments: Reduced labial seal Oral Phase Functional Implications: Right anterior spillage;Left anterior spillage;Oral residue;Oral holding Pharyngeal Phase Impairments: Other (comments)(no palpable swallow observed)   Thin Liquid Thin Liquid: Impaired Presentation: Spoon Oral Phase Impairments: Reduced labial seal Oral Phase Functional Implications: Right anterior spillage;Left anterior spillage;Prolonged oral transit    Nectar Thick Nectar Thick Liquid: Not tested   Honey Thick Honey Thick Liquid: Not tested   Puree Puree: Not tested   Solid     Solid: Not tested      Gina Hampton 04/26/2020,10:15 AM  Marchelle Folks L. Samson Frederic, MA CCC/SLP Acute Rehabilitation Services Office number [phone] Pager [phone]

## 2020-04-26 NOTE — Consult Note (Addendum)
Consultation Note Date: 04/26/2020   Patient Name: Gina Hampton  DOB: 1936/09/03  MRN: [id number]  Age / Sex: 84 y.o., female  PCP: Adin Hector, MD Referring Physician: Sid Falcon, MD  Reason for Consultation: Establishing goals of care  HPI/Patient Profile: 84 y.o. female  with past medical history of seizures, HTN, and dementia admitted on 04/25/2020 with left MCA CVA. She was not a candidate for TPA. She is currently nonverbal and with significant dysphagia and right hemiparesis.    Clinical Assessment and Goals of Care:  I have reviewed medical records including EPIC notes, labs and imaging, examined the patient and met at bedside with sister Gina Hampton and daughter Gina Hampton  to discuss diagnosis prognosis, [place], EOL wishes, disposition and options.  I introduced Palliative Medicine as specialized medical care for people living with serious illness. It focuses on providing relief from the symptoms and stress of a serious illness.   We discussed a brief life review of the patient. Gina Hampton describes her sister as "never having a chance". Gina Hampton was diagnosed with a seizure disorder at age 42. She was removed from school in 4th grade due to her seizures were causing disruptions in the classroom. She was married at age 41, and had her daughter Gina Hampton. Gina Hampton reports Gina Hampton has never been employed. She has been a resident of [facility] for approximately 8 years (4 years in [place], 4 years in [place]).   As far as functional and prior to admission, she was mostly wheelchair bound but  relatively independent with ADLs. She had severe dementia with behavioral disturbance- her sister notes she would kick and hit.  We discussed her current illness and what it means in the larger context of her ongoing co-morbidities.  Natural disease trajectory and expectations at EOL were discussed.  I attempted to elicit  values and goals of care important to the patient. Gina Hampton and Gina Hampton note that patient has expressed her readiness to die. She felt she had poor quality of life.  The difference between aggressive medical intervention and comfort care was considered in light of the patient's goals of care. Gina Hampton and Gina Hampton have a clear understanding that Gina Hampton has suffered a large debilitating stroke and they express their wishes are to keep her comfortable and allow for natural death.   Hospice residential care services were explained and offered. They would like her to go to a hospice facility in [place] since [place] lives there and can visit her daily.   Questions and concerns were addressed.  The family was encouraged to call with questions or concerns.    Primary decision maker: Gina Hampton, sister and HCPOA  SUMMARY OF RECOMMENDATIONS   - Transition to full comfort care - symptom management per end of life order set  - continue IV keppra for history of seizures - place foley catheter for comfort - TOC order placed for residential hospice- family requests Hospice Home in [place]:  DNR   Symptom Management:  Per end of  life order set  Palliative Prophylaxis:   Aspiration, Frequent Pain Assessment and Oral Care  Additional Recommendations (Limitations, Scope, Preferences):  Full Comfort Care  Psycho-social/Spiritual:   Desire for further Chaplaincy support:no  Additional Recommendations: Education on Hospice  Prognosis:   < 2 weeks  Discharge Planning: Hospice facility      Primary Diagnoses: Present on Admission: . Acute ischemic left MCA stroke ([place])   I have reviewed the medical record, interviewed the patient and family, and examined the patient. The following aspects are pertinent.  Past Medical History:  Diagnosis Date  . Hypertension   . Seizures ([place])    Social History   Socioeconomic History  . Marital  status: Single    Spouse name: Not on file  . Number of children: Not on file  . Years of education: Not on file  . Highest education level: Not on file  Occupational History  . Not on file  Tobacco Use  . Smoking status: Never Smoker  . Smokeless tobacco: Never Used  Substance and Sexual Activity  . Alcohol use: No  . Drug use: No  . Sexual activity: Not on file  Other Topics Concern  . Not on file  Social History Narrative  . Not on file     Family History  Problem Relation Age of Onset  . Cancer Mother    Scheduled Meds: . morphine CONCENTRATE  5 mg Sublingual Q6H  . sodium chloride flush  3 mL Intravenous Once   Continuous Infusions: . levETIRAcetam 1,000 mg (04/26/20 0923)   PRN Meds:.acetaminophen **OR** acetaminophen, antiseptic oral rinse, glycopyrrolate, haloperidol lactate, ipratropium-albuterol, LORazepam, morphine injection, [DISCONTINUED] morphine CONCENTRATE **OR** morphine CONCENTRATE, ondansetron **OR** ondansetron (ZOFRAN) IV, polyvinyl alcohol Medications Prior to Admission:  Prior to Admission medications   Medication Sig Start Date End Date Taking? Authorizing Provider  atorvastatin (LIPITOR) 40 MG tablet Take 40 mg by mouth at bedtime. 02/18/20  Yes [provider]  carbamazepine (TEGRETOL) 200 MG tablet Take 200-300 mg by mouth See admin instructions. Takes 300 mg by mouth in the morning and 200 mg in the evening.   Yes [provider]  Cholecalciferol (VITAMIN D) 50 MCG (2000 UT) CAPS Take 2,000 Units by mouth daily.   Yes [provider]  citalopram (CELEXA) 20 MG tablet Take 20 mg by mouth daily.   Yes [provider]  cloNIDine (CATAPRES) 0.1 MG tablet Take 0.1 mg by mouth 2 (two) times daily.   Yes [provider]  donepezil (ARICEPT) 10 MG tablet Take 10 mg by mouth at bedtime.   Yes [provider]  gabapentin (NEURONTIN) 300 MG capsule Take 600 mg by mouth daily.    Yes [provider]  levETIRAcetam (KEPPRA) 1000 MG tablet Take 1,000 mg by mouth 2 (two) times daily. 03/29/20  Yes [provider]  lisinopril (PRINIVIL,ZESTRIL) 40 MG tablet Take 1 tablet (40 mg total) by mouth daily. 01/25/17  Yes Mody, Ulice Bold, MD  memantine (NAMENDA) 5 MG tablet Take 5 mg by mouth daily.  04/08/20  Yes [provider]  QUEtiapine (SEROQUEL) 25 MG tablet Take 25 mg by mouth 2 (two) times daily.  04/19/20  Yes [provider]  ipratropium-albuterol (DUONEB) 0.5-2.5 (3) MG/3ML SOLN Take 3 mLs by nebulization every 6 (six) hours as needed. Patient not taking: Reported on 04/25/2020 01/25/17   Bettey Costa, MD   No Known Allergies Review of Systems  Unable to perform ROS: Patient nonverbal    Physical Exam Vitals  reviewed.  Constitutional:      General: She is not in acute distress.    Appearance: She is ill-appearing.  HENT:     Head: Normocephalic and atraumatic.  Cardiovascular:     Rate and Rhythm: Tachycardia present.  Pulmonary:     Effort: Pulmonary effort is normal.     Vital Signs: BP (!) 147/111 (BP Location: Left Arm)   Pulse (!) 102   Temp 98.8 F (37.1 C) (Axillary)   Resp 16   Ht 5' 2"  (1.575 m)   Wt 63.5 kg   SpO2 100%   BMI 25.60 kg/m  Pain Scale: FLACC       SpO2: SpO2: 100 % O2 Device:SpO2: 100 % O2 Flow Rate: .   IO: Intake/output summary:   Intake/Output Summary (Last 24 hours) at 04/26/2020 1602 Last data filed at 04/26/2020 0600 Gross per 24 hour  Intake --  Output 150 ml  Net -150 ml    LBM:   Baseline Weight: Weight: 63.5 kg Most recent weight: Weight: 63.5 kg      Palliative Assessment/Data: 10%     Time In: 3:30 Time Out: 4:40 Time Total: 70 minutes Greater than 50%  of this time was spent counseling and coordinating care related to the above assessment and plan.  Signed by: Lavena Bullion, NP  Mariana Kaufman, AGNP-C Palliative Medicine     Please contact Palliative Medicine Team phone at [phone]  for questions and concerns.  For individual provider: See Shea Evans

## 2020-04-26 NOTE — Progress Notes (Signed)
   Subjective:  Patient is nonverbal and unresponsive to voice prompts, but appears to be in no apparent distress. Patient's sister and daughter are bedside.  Objective:  Vital signs in last 24 hours: Vitals:   04/26/20 0016 04/26/20 0202 04/26/20 0600 04/26/20 0829  BP: (!) 150/85 (!) 154/93 (!) 145/111 (!) 125/94  Pulse:  (!) 108  (!) 104  Resp:  18  18  Temp:    98.5 F (36.9 C)  TempSrc:    Axillary  SpO2:  98% 98% 97%  Weight:      Height:        Intake/Output Summary (Last 24 hours) at 04/26/2020 1120 Last data filed at 04/26/2020 0600 Gross per 24 hour  Intake --  Output 150 ml  Net -150 ml   Physical Exam: General: Nonverbal, unresponsive, and in no apparent distress. Head: Normocephalic, atraumatic. Eyes: Leftward eye gaze. Neurologic: Awake, unresponsive. Right lower extremity withdraws to light stimulus. Right upper extremity not responsive to light stimulus. Has mild grip strength in left hand and spontaneous movement of left upper and lower extremities. Cardiovascular: S1 normal. S2 normal. RRR, 3/6 holosystolic flow murmurs best heard at the left upper sternal border; no rubs or gallops. GI: Abdomen soft, non-tender. No distension. Normoactive bowel sounds appreciated. Psych: Mood and affect unable to be assessed at this time.   Assessment/Plan:  Active Problems:   Acute ischemic left MCA stroke (HCC)   # Acute ischemic left MCA stroke Gina Hampton is a 84 yo female with a past medical history significant for hypertension, seizures, and dementia who presented to the emergency department yesterday as code stroke. Left MCA stroke was confirmed with CT imaging. Plan: - After conversation with family, we believe the best course of action is palliative/hospice care consultation. Palliative care team has been consulted and should be by to see the patient and family today. - Continue patients NPO status. Speech and language pathology assessed patient this  morning and observed no discernable swallow response and recommended continued NPO status and oral care.  #Arrhythmia Overnight, patient had a 1-2 second spike of SVT that quickly returned to sinus tachycardia. Subsequently, rhythm converted to atrial fibrillation with heart rate at 110. Patient was asymptomatic and no interventions were performed. Plan: -Await palliative consult. Discussed with patient's family, who again stated wishes for less extensive management with emphasis on comfort care. Will continue to monitor patients vitals.  # Hypertension Patient has history of hypertension that was controlled outpatient with lisinopril and clonidine. Plan: - Continue to withhold hypertension management for permissive hypertension and patients NPO status.  # Seizure disorder Patient has a chronic history of seizures that has been well controlled with outpatient medication with Keppra and tegretol. Plan: - Pharmacy consulted yesterday and patient's Keppra switch to IV formulation. Tegretol not available in IV formulation. Will continue to monitor seizure status.  # Dementia Patient has a history a dementia for which she takes namenda, seroquel, and Donepezil.  Plan: -Will hold these medications in setting of inability to take PO.  DVT prophylaxis: SCDs Diet: NPO, hold oral medications.   LOS: 1 day   Sol Blazing, Medical Student 04/26/2020, 11:20 AM

## 2020-04-26 NOTE — Evaluation (Signed)
Occupational Therapy Evaluation Patient Details Name: Gina Hampton MRN: [id number] DOB: 1936/03/13 Today's Date: 04/26/2020    History of Present Illness 84 yo female with a past medical history significant for seizures, hypertension, and dementia who presented to the emergency department 04/25/20 from SNF with rt sided weakness, non-verbal, and leftward gaze. NIHSS 24; CT imaging revealed left MCA stroke. Palliative consult pending   Clinical Impression   PT admitted with L MCA. Pt currently with functional limitiations due to the deficits listed below (see OT problem list). Pt prior to admission was able to ambulate and take care of self care per chart review. Pt currently requires total +2 total (A) for all care. Pt noted to have R 4th toe injury and RN made aware. Pt with strong L gaze preference. Pt does not follow any commands Pt will benefit from skilled OT to increase their independence and safety with adls and balance to allow discharge SNF.      Follow Up Recommendations  SNF;Other (comment)(pending family wishes for POC)    Financial risk analyst cushion (measurements OT);Wheelchair (measurements OT);Hospital bed;Other (comment)(hoyer)    Recommendations for Other Services Other (comment)(Palliative care)     Precautions / Restrictions Precautions Precautions: Fall      Mobility Bed Mobility Overal bed mobility: Needs Assistance Bed Mobility: Rolling Rolling: Total assist;+2 for physical assistance;+2 for safety/equipment         General bed mobility comments: rolling rt and left multiple times with no attempt to assist (eyes open throughout); noted underpad damp from urine and from drooling while on her right side; repositioned up in bed on her back with HOB 30 degrees  Transfers                      Balance                                           ADL either performed or assessed with clinical judgement   ADL  Overall ADL's : Needs assistance/impaired                                       General ADL Comments: total (A) for all care at this time     Vision   Vision Assessment?: Yes Eye Alignment: Impaired (comment) Alignment/Gaze Preference: Gaze left Additional Comments: difficult to fully assess but noted to have gaze preference without any midline attempts     Perception     Praxis      Pertinent Vitals/Pain Pain Assessment: No/denies pain Faces Pain Scale: No hurt     Hand Dominance Right   Extremity/Trunk Assessment Upper Extremity Assessment Upper Extremity Assessment: RUE deficits/detail RUE Deficits / Details: no active ROM noted throuugh session.    Lower Extremity Assessment Lower Extremity Assessment: Defer to PT evaluation RLE Deficits / Details: spontaneous flexion rt hip and knee noted (<1/2 ROM); also noted blood on end of her sock; removed and noted wound to 4th toe--RN notified and in to assess   Cervical / Trunk Assessment Cervical / Trunk Assessment: Kyphotic   Communication Communication Communication: Expressive difficulties;Receptive difficulties   Cognition Arousal/Alertness: Awake/alert Behavior During Therapy: Flat affect;Restless Overall Cognitive Status: Difficult to assess  General Comments: Repeatedly raising LUE overhead; bending/straightening LLE   General Comments  HR 115- 132 with minimal activity, BP 141/119. Pt noted to have blood on R sock. sock removed and discovered wound to 4th toes. Rn notified    Exercises     Shoulder Instructions      Home Living Family/patient expects to be discharged to:: Unsure                                 Additional Comments: ?Hospice per chart; ?return SNF      Prior Functioning/Environment          Comments: per chart was ambulatory, verbal, able to feed herself at the SNF        OT Problem List: Decreased  strength;Decreased activity tolerance;Impaired balance (sitting and/or standing);Decreased coordination;Impaired vision/perception;Decreased cognition;Decreased safety awareness;Decreased knowledge of use of DME or AE;Decreased knowledge of precautions;Cardiopulmonary status limiting activity;Impaired UE functional use      OT Treatment/Interventions: Self-care/ADL training;Therapeutic exercise;Neuromuscular education;Energy conservation;DME and/or AE instruction;Manual therapy;Modalities;Therapeutic activities;Cognitive remediation/compensation;Visual/perceptual remediation/compensation;Patient/family education;Balance training    OT Goals(Current goals can be found in the care plan section) Acute Rehab OT Goals Patient Stated Goal: pt unable to state OT Goal Formulation: Patient unable to participate in goal setting Time For Goal Achievement: 05/10/20 Potential to Achieve Goals: Fair  OT Frequency: Min 2X/week   Barriers to D/C: Decreased caregiver support  f       Co-evaluation PT/OT/SLP Co-Evaluation/Treatment: Yes Reason for Co-Treatment: For patient/therapist safety;To address functional/ADL transfers;Complexity of the patient's impairments (multi-system involvement)   OT goals addressed during session: ADL's and self-care;Strengthening/ROM      AM-PAC OT "6 Clicks" Daily Activity     Outcome Measure Help from another person eating meals?: Total Help from another person taking care of personal grooming?: Total Help from another person toileting, which includes using toliet, bedpan, or urinal?: Total Help from another person bathing (including washing, rinsing, drying)?: Total Help from another person to put on and taking off regular upper body clothing?: Total Help from another person to put on and taking off regular lower body clothing?: Total 6 Click Score: 6   End of Session Nurse Communication: Mobility status;Precautions  Activity Tolerance: Patient tolerated treatment  well Patient left: in bed;with call bell/phone within reach;with bed alarm set;with nursing/sitter in room  OT Visit Diagnosis: Unsteadiness on feet (R26.81);Muscle weakness (generalized) (M62.81)                Time: 6387-5643 OT Time Calculation (min): 18 min Charges:  OT General Charges $OT Visit: 1 Visit OT Evaluation $OT Eval Moderate Complexity: 1 Mod   Brynn, OTR/L  Acute Rehabilitation Services Pager: [phone] Office: [phone] .   Mateo Flow 04/26/2020, 1:58 PM

## 2020-04-26 NOTE — Evaluation (Signed)
Physical Therapy Evaluation Patient Details Name: Gina Hampton MRN: [id number] DOB: 1936/09/29 Today's Date: 04/26/2020   History of Present Illness  84 yo female with a past medical history significant for seizures, hypertension, and dementia who presented to the emergency department 04/25/20 from SNF with rt sided weakness, non-verbal, and leftward gaze. NIHSS 24; CT imaging revealed left MCA stroke. Palliative consult pending  Clinical Impression   Pt admitted with above diagnosis. Patient with rt hemiparesis and rt inattention with strong left gaze preference. She demonstrated some spontaneous movement of RLE, but not on command. Alert throughout bed level assessment (further activity deferred due to elevated HR to 132 bpm and BP 141/119 with bed level activity). Per chart, PTA patient resided in SNF and was ambulatory. Pt currently with functional limitations due to the deficits listed below (see PT Problem List). Pt may benefit from skilled PT to increase their independence and safety with mobility to allow discharge to the venue listed below.  *Noted Palliative Consult pending and per record family is open to idea of Hospice. Will follow.      Follow Up Recommendations SNF;Supervision/Assistance - 24 hour(if family chooses to pursue rehab; ? may choose Hospicc)    Equipment Recommendations  Other (comment)(air mattress)    Recommendations for Other Services       Precautions / Restrictions Precautions Precautions: Fall      Mobility  Bed Mobility Overal bed mobility: Needs Assistance Bed Mobility: Rolling Rolling: Total assist;+2 for physical assistance;+2 for safety/equipment         General bed mobility comments: rolling rt and left multiple times with no attempt to assist (eyes open throughout); noted underpad damp from urine and from drooling while on her right side; repositioned up in bed on her back with HOB 30 degrees  Transfers                     Ambulation/Gait                Stairs            Wheelchair Mobility    Modified Rankin (Stroke Patients Only)       Balance                                             Pertinent Vitals/Pain Pain Assessment: Faces Faces Pain Scale: No hurt    Home Living Family/patient expects to be discharged to:: Unsure                 Additional Comments: ?Hospice per chart; ?return SNF    Prior Function           Comments: per chart was ambulatory, verbal, able to feed herself at the SNF     Hand Dominance        Extremity/Trunk Assessment   Upper Extremity Assessment Upper Extremity Assessment: Defer to OT evaluation    Lower Extremity Assessment Lower Extremity Assessment: RLE deficits/detail;Difficult to assess due to impaired cognition RLE Deficits / Details: spontaneous flexion rt hip and knee noted (<1/2 ROM); also noted blood on end of her sock; removed and noted wound to 4th toe--RN notified and in to assess    Cervical / Trunk Assessment Cervical / Trunk Assessment: Kyphotic  Communication   Communication: Expressive difficulties;Receptive difficulties  Cognition Arousal/Alertness: Awake/alert Behavior During Therapy: Flat affect;Restless Overall Cognitive  Status: Difficult to assess                                 General Comments: Repeatedly raising LUE overhead; bending/straightening LLE      General Comments General comments (skin integrity, edema, etc.): strong left gaze preference; HR 115-132 bpm with minimal activity; BP 141/119 in supine    Exercises     Assessment/Plan    PT Assessment Patient needs continued PT services(pending family's decision re: ?hospice)  PT Problem List Decreased strength;Decreased activity tolerance;Decreased balance;Decreased mobility;Decreased knowledge of use of DME;Cardiopulmonary status limiting activity;Impaired sensation;Impaired tone       PT Treatment  Interventions DME instruction;Gait training;Functional mobility training;Therapeutic activities;Balance training;Neuromuscular re-education;Cognitive remediation;Patient/family education    PT Goals (Current goals can be found in the Care Plan section)  Acute Rehab PT Goals Patient Stated Goal: pt unable to state PT Goal Formulation: Patient unable to participate in goal setting Time For Goal Achievement: 05/10/20 Potential to Achieve Goals: Fair    Frequency Min 2X/week   Barriers to discharge Other (comment)(from SNF)      Co-evaluation               AM-PAC PT "6 Clicks" Mobility  Outcome Measure Help needed turning from your back to your side while in a flat bed without using bedrails?: Total Help needed moving from lying on your back to sitting on the side of a flat bed without using bedrails?: Total Help needed moving to and from a bed to a chair (including a wheelchair)?: Total Help needed standing up from a chair using your arms (e.g., wheelchair or bedside chair)?: Total Help needed to walk in hospital room?: Total Help needed climbing 3-5 steps with a railing? : Total 6 Click Score: 6    End of Session   Activity Tolerance: Treatment limited secondary to medical complications (Comment)(decr ability to follow commands and elevated HR) Patient left: in bed;with call bell/phone within reach;with bed alarm set;with nursing/sitter in room Nurse Communication: Other (comment)(bleeding rt 4th toe) PT Visit Diagnosis: Hemiplegia and hemiparesis Hemiplegia - Right/Left: Right Hemiplegia - dominant/non-dominant: Dominant Hemiplegia - caused by: Cerebral infarction    Time: 4580-9983 PT Time Calculation (min) (ACUTE ONLY): 18 min   Charges:   PT Evaluation $PT Eval Low Complexity: 1 Low           Jerolyn Center, PT Pager [phone]   Zena Amos 04/26/2020, 12:33 PM

## 2020-04-26 NOTE — TOC Progression Note (Deleted)
Transition of Care [facility]) - Progression Note    Patient Details  Name: Gina Hampton MRN: [id number] Date of Birth: [birth year]  [facility]) CM/SW Contact  Terrilee Croak, Student-Social Work Phone Number: 04/26/2020, 4:30 PM  Clinical Narrative:    SW TOC consulted for hospice. Referal sent to [facility] [place]. They will pull the information and follow up. SW will continue to follow.        Expected Discharge Plan and Services                                                 Social Determinants of Health (SDOH) Interventions    Readmission Risk Interventions No flowsheet data found.

## 2020-04-26 NOTE — Progress Notes (Signed)
  Date: 04/26/2020  Patient name: Gina Hampton  Medical record number: [id number]  Date of birth: May 05, 1936   I have seen and evaluated Gina Hampton and discussed their care with the Residency Team. Briefly, Gina Hampton is an 84 year old woman who presented with a large Lt MCA stroke.  She has been only minimally responsive.  Withdraws today from pain, has leftward gaze deviation.  Family is aware of seriousness of situation and would like to discuss further options with palliative care.    Vitals:   04/26/20 0829 04/26/20 1215  BP: (!) 125/94 (!) 147/111  Pulse: (!) 104 (!) 102  Resp: 18 16  Temp: 98.5 F (36.9 C) 98.8 F (37.1 C)  SpO2: 97% 100%   General: Sitting up in bed, no response to verbal stimuli Eyes: leftward gaze deviation, she has severe conjunctival injection of the right eye with some clear discharge HENT: Neck is mobile, but she does not respond to commands to move her neck or shoulders CV: Rapid rate, irreg irreg, + 3/6 holosystolic murmur Pulm: Normal respirations, no increased work of breathing Abd: Soft, +BS MSK: contracture to the left foot, withdraws to light touch in the feet, more so on the right.  Neuro: Drooling, leftward gaze deviation, will grasp hand on the left at request, no movement of the left arm.  Withdraws to pain in both LE.  Otherwise, did not respond to exam requests.   Assessment and Plan: I have seen and evaluated the patient as outlined above. I agree with the formulated Assessment and Plan as detailed in the residents' note, with the following changes:   1. Left MCA stroke - Allow permissive hypertension - After discussion with family, prefer to discuss comfort care - Palliative care consult pending  2. Irreg rhythm, likely Afib - No further intervention, could consider TTE - Once discussion with Palliative occurs, will decide with family regarding further work up.   DNR.   Other issues per MS3 Francisco's note.   Inez Catalina, MD 4/27/20211:16 PM

## 2020-04-26 NOTE — Progress Notes (Signed)
Pt has a bloody pinkie toe nails that looks like is coming off due to some sort of trauma, and a pressure ulcer on the heel that looks like is healing. Were not documented upon admission but assessed this morning and documented in the wound section.

## 2020-04-26 NOTE — Progress Notes (Signed)
I responded to offer spiritual support to patient and family. Pt was unresponsive and sister and daughter were at bedside. Sister stated she has been dealing with multiple care opportunities with friends and loved ones the past few years. She said she doing fine with what is going on with Gina Hampton. She says she is not religious but comes from a Jewish religion. However she welcomed the Chaplain support graciously. She mentioned how Gina Hampton got to the hospital and was looking looking forward to speak with someone with the palliative care team at 3:30 today. I offered support with words of comfort, empathic listening and ministry of presence. Chaplain services available as needed.   Chaplain Resident Orest Dikes  [phone]

## 2020-04-26 NOTE — Progress Notes (Signed)
Pt MEWS score between red and yellow due to  sbp>180s and HR in the140s. MD is aware and family has expressed the need for palliative care and do not want [place] to advance care.

## 2020-04-26 NOTE — Progress Notes (Signed)
[facility])  Referral received for hospice services once pt discharges.  Attempted to contact her sister to answer questions and provide support.  [facility] will follow up with hospital team and family in am to see what d/c plan is.  Wallis Bamberg RN, BSN, CCRN [facility] Liaison (in [place]) [phone]

## 2020-04-27 DIAGNOSIS — Z515 Encounter for palliative care: Secondary | ICD-10-CM

## 2020-04-27 DIAGNOSIS — G40909 Epilepsy, unspecified, not intractable, without status epilepticus: Secondary | ICD-10-CM

## 2020-04-27 LAB — RESPIRATORY PANEL BY RT PCR (FLU A&B, COVID)
Influenza A by PCR: NEGATIVE
Influenza B by PCR: NEGATIVE
SARS Coronavirus 2 by RT PCR: NEGATIVE

## 2020-04-27 MED ORDER — HALOPERIDOL LACTATE 5 MG/ML IJ SOLN: 0.5000 mg | INTRAMUSCULAR | Status: AC | PRN

## 2020-04-27 MED ORDER — ONDANSETRON 4 MG PO TBDP: 4.0000 mg | ORAL_TABLET | Freq: Four times a day (QID) | ORAL | 0 refills | Status: AC | PRN

## 2020-04-27 MED ORDER — BIOTENE DRY MOUTH MT LIQD: 15.0000 mL | OROMUCOSAL | Status: AC | PRN

## 2020-04-27 MED ORDER — MORPHINE SULFATE (CONCENTRATE) 10 MG/0.5ML PO SOLN: 5.0000 mg | ORAL | Status: AC | PRN

## 2020-04-27 MED ORDER — LEVETIRACETAM IN NACL 1000 MG/100ML IV SOLN: 1000.0000 mg | Freq: Two times a day (BID) | INTRAVENOUS | Status: AC

## 2020-04-27 MED ORDER — POLYVINYL ALCOHOL 1.4 % OP SOLN: 1.0000 [drp] | Freq: Four times a day (QID) | OPHTHALMIC | 0 refills | Status: AC | PRN

## 2020-04-27 MED ORDER — LORAZEPAM 2 MG/ML IJ SOLN: 1.0000 mg | INTRAMUSCULAR | 0 refills | Status: AC | PRN

## 2020-04-27 MED ORDER — ONDANSETRON HCL 4 MG/2ML IJ SOLN: 4.0000 mg | Freq: Four times a day (QID) | INTRAMUSCULAR | 0 refills | Status: AC | PRN

## 2020-04-27 MED ORDER — MORPHINE SULFATE (PF) 2 MG/ML IV SOLN: 2.0000 mg | INTRAVENOUS | 0 refills | Status: AC | PRN

## 2020-04-27 MED ORDER — GLYCOPYRROLATE 0.2 MG/ML IJ SOLN: 0.2000 mg | INTRAMUSCULAR | Status: AC | PRN

## 2020-04-27 MED ORDER — MORPHINE SULFATE (CONCENTRATE) 10 MG/0.5ML PO SOLN: 5.0000 mg | Freq: Four times a day (QID) | ORAL | Status: AC

## 2020-04-27 NOTE — TOC Transition Note (Signed)
Transition of Care [facility]) - CM/SW Discharge Note   Patient Details  Name: Gina Hampton MRN: [id number] Date of Birth: 1936/02/04  Transition of Care [facility]) CM/SW Contact:  Terrilee Croak, Student-Social Work Phone Number: 04/27/2020, 3:23 PM   Clinical Narrative:     Nurse to call report to [phone]      Barriers to Discharge: Other (comment)(Pending Covid results.)   Patient Goals and CMS Choice Patient states their goals for this hospitalization and ongoing recovery are:: Pt unable to participate in goal setting due to disorientation. CMS Medicare.gov Compare Post Acute Care list provided to:: Patient Represenative (must comment) Choice offered to / list presented to : Adult Children  Discharge Placement                       Discharge Plan and Services In-house Referral: Hospice / Palliative Care                                   Social Determinants of Health (SDOH) Interventions     Readmission Risk Interventions No flowsheet data found.

## 2020-04-27 NOTE — Progress Notes (Signed)
Discharged to hospice facility on stretcher accompanied by 2 transporters.  IV access remains intact and has on silver colored bracelet with metal color on it and a silver color ring with stones on it.  Dentures did accompany her.  Entire room and drawers inventoried to ensure all belongings went with her.  Did receive scheduled dose of Morphine sublingually prior to discharge.

## 2020-04-27 NOTE — Progress Notes (Signed)
Report given to Clydie Braun at [facility] facility.

## 2020-04-27 NOTE — Progress Notes (Signed)
Daily Progress Note   Patient Name: Gina Hampton       Date: 04/27/2020 DOB: 1936-09-12  Age: 84 y.o. MRN#: [id number] Attending Physician: Inez Catalina, MD Primary Care Physician: Lynnea Ferrier, MD Admit Date: 04/25/2020  Reason for Consultation/Follow-up: Follow-up for goals of care and symptom management.  Patient Profile: 84 y.o. female  with past medical history of seizures, HTN, and dementia admitted on 04/25/2020 with left MCA CVA. She was not a candidate for TPA. She is currently nonverbal and with significant dysphagia and right hemiparesis.    Subjective: Patient lying in the bed, opens her eyes to voice, is non-verbal, and does not follow simple commands. Appears comfortable. There is no family currently present at the bedside.    Length of Stay: 2  Current Medications: Scheduled Meds:  . morphine CONCENTRATE  5 mg Sublingual Q6H  . sodium chloride flush  3 mL Intravenous Once    Continuous Infusions: . levETIRAcetam 1,000 mg (04/27/20 0828)    PRN Meds: acetaminophen **OR** acetaminophen, antiseptic oral rinse, glycopyrrolate, haloperidol lactate, ipratropium-albuterol, LORazepam, morphine injection, [DISCONTINUED] morphine CONCENTRATE **OR** morphine CONCENTRATE, ondansetron **OR** ondansetron (ZOFRAN) IV, polyvinyl alcohol  Physical Exam Vitals reviewed.  Constitutional:      General: She is not in acute distress.    Appearance: She is ill-appearing.  HENT:     Head: Normocephalic and atraumatic.     Mouth/Throat:     Comments: Moderate oral secretions noted. Pulmonary:     Effort: Pulmonary effort is normal.  Skin:    General: Skin is warm and dry.  Neurological:     Mental Status: She is lethargic.     GCS: GCS eye subscore is 3. GCS verbal  subscore is 1. GCS motor subscore is 4.             Vital Signs: BP (!) 125/102 (BP Location: Left Arm)   Pulse (!) 110   Temp 97.9 F (36.6 C) (Oral)   Resp 18   Ht 5\' 2"  (1.575 m)   Wt 63.5 kg   SpO2 97%   BMI 25.60 kg/m  SpO2: SpO2: 97 % O2 Device: O2 Device: Room Air O2 Flow Rate:    Intake/output summary:   Intake/Output Summary (Last 24 hours) at 04/27/2020 1531 Last data filed at 04/27/2020 0900  Gross per 24 hour  Intake 0 ml  Output 300 ml  Net -300 ml   LBM:   Baseline Weight: Weight: 63.5 kg Most recent weight: Weight: 63.5 kg       Palliative Assessment/Data: 10%     Patient Active Problem List   Diagnosis Date Noted  . Dysphagia   . Dementia with behavioral disturbance ([place])   . Do not resuscitate   . Advanced care planning/counseling discussion   . Goals of care, counseling/discussion   . Palliative care by specialist   . Acute ischemic left MCA stroke ([place]) 04/25/2020  . Influenza A 01/23/2017    Palliative Care Assessment & Plan    Assessment: Patient appears comfortable and does not need additional symptom management at this time.   Recommendations/Plan:  Continue full comfort care   Continue current symptom management per comfort orders   Code Status: DNR       Advance Directive Documentation     Most Recent Value  Type of Advance Directive  Out of facility DNR (pink MOST or yellow form)  Pre-existing out of facility DNR order (yellow form or pink MOST form)  Pink Most/Yellow Form available - Physician notified to receive inpatient order  "MOST" Form in Place?  --      Prognosis:   < 2 weeks  Discharge Planning:  Hospice facility    Thank you for allowing the Palliative Medicine Team to assist in the care of this patient.   Total Time 15 minutes Prolonged Time Billed  no       Greater than 50%  of this time was spent counseling and coordinating care related to the above assessment and plan.  Lavena Bullion,  NP Mariana Kaufman, AGNP-C Palliative Medicine   Please contact Palliative Medicine Team phone at [phone] for questions and concerns.     Marland Kitchen

## 2020-04-27 NOTE — Progress Notes (Signed)
[facility] 4Y18        [facility] ([facility])  Gina Hampton is a direct admit to [facility] IPU with a terminal dx of CVA. This RN spoke with POA sister Gina Hampton who verbalized desire for comfort care only and accepted bed for pt at [facility].  Bedside RN Gina Hampton and SW team provided with number for report, [phone].  Pt can be transported after 4pm.    Thank you for this referral.  Gillian Scarce, BSN, RN [facility] Liaison (in [place]) [phone]

## 2020-04-27 NOTE — Progress Notes (Signed)
   Subjective:  Patient nonverbal and unresponsive to voice prompts, but in no apparent distress.  Objective:  Vital signs in last 24 hours: Vitals:   04/26/20 1215 04/26/20 1611 04/26/20 2008 04/26/20 2334  BP: (!) 147/111 (!) 150/112 (!) 155/108 (!) 155/108  Pulse: (!) 102 (!) 52 (!) 109 78  Resp: 16 16 16 16   Temp: 98.8 F (37.1 C) 99.4 F (37.4 C) 97.9 F (36.6 C) 98.8 F (37.1 C)  TempSrc: Axillary Oral    SpO2: 100% 97% 97% 98%  Weight:      Height:        Intake/Output Summary (Last 24 hours) at 04/27/2020 04/29/2020 Last data filed at 04/27/2020 0310 Gross per 24 hour  Intake --  Output 300 ml  Net -300 ml   General: Patient is sitting up in bed, laying on right side. No response to verbal stimuli. Head: Normocephalic, atraumatic. Eyes: Leftward eye gaze. Neurologic: Awake, unresponsive. Right lower extremity withdraws to light stimulus. Right upper extremity not responsive to light stimulus. Spontaneous left upper and lower extremity movements. Respiratory: No increased work of breathing, faint lung sounds clear to auscultation. GI: Abdomen soft, non-tender. No distension. Psych: Mood and affect unable to be assessed at this time.   Assessment/Plan:  Active Problems:   Acute ischemic left MCA stroke (HCC)   Dysphagia   Dementia with behavioral disturbance (HCC)   Do not resuscitate   Advanced care planning/counseling discussion   Goals of care, counseling/discussion   Palliative care by specialist  1) Acute ischemic left MCA stroke Gina Hampton is a 84 year old female with a past medical history significant for hypertension, seizures, and dementia who presented to the emergency department two days ago as code stroke. Left MCA stroke confirmed by CT imaging. Plan: - Discharge to inpatient Hospice facility. Palliative care saw patient yesterday and recommended TOC to residential hospice. Referral was sent to hospice of [facility]).  Spoke with case manager, patient is medically stable for discharge to hospice, we are awaiting hospice bed to open up. - IV Robinul, PRN for excessive salivary, tracheobronchial, and pharyngeal secretions. - IV Morphine, PRN for pain/shortness of breath. - IV Zofran, PRN for nausea. - Liquifilm Tears, eye drops, PRN for dry eyes. - Maintain NPO status.  2) Arrhythmia Two nights ago, patient had a 1-2 second spike of SVT that quickly returned to sinus tachycardia. Subsequently, rhythm converted to atrial fibrillation with heart rate at 110. Patient was asymptomatic and no interventions were performed. Plan: - Per palliative care, continue supportive comfort care. No aggressive interventions.  3) Seizure disorder Patient has a chronic history of seizures that has been well controlled with outpatient medications Keppra and tegretol. Plan: - Continue IV Keppra.  4) Dementia Patient has a history a dementia for which she takes namenda, seroquel, and Donepezil. Plan: -Will hold these medications in setting of inability to take PO.  Diet: NPO, hold oral medications.  Code Status: DNR    LOS: 2 days   Estate agent, Medical Student 04/27/2020, 7:21 AM

## 2020-04-27 NOTE — Discharge Summary (Signed)
Name: Gina Hampton MRN: [id number] DOB: July 17, 1936 84 y.o. PCP: Lynnea Ferrier, MD  Date of Admission: 04/25/2020  1:08 PM Date of Discharge: 04/27/2020 Attending Physician: Debe Coder, MD FACP  Discharge Diagnosis: 1. Acute left MCA infarct 2. Seizure disorder  Discharge Medications: Allergies as of 04/27/2020   No Known Allergies     Medication List    STOP taking these medications   atorvastatin 40 MG tablet Commonly known as: LIPITOR   carbamazepine 200 MG tablet Commonly known as: TEGRETOL   citalopram 20 MG tablet Commonly known as: CELEXA   cloNIDine 0.1 MG tablet Commonly known as: CATAPRES   donepezil 10 MG tablet Commonly known as: ARICEPT   gabapentin 300 MG capsule Commonly known as: NEURONTIN   ipratropium-albuterol 0.5-2.5 (3) MG/3ML Soln Commonly known as: DUONEB   levETIRAcetam 1000 MG tablet Commonly known as: KEPPRA   lisinopril 40 MG tablet Commonly known as: ZESTRIL   memantine 5 MG tablet Commonly known as: NAMENDA   QUEtiapine 25 MG tablet Commonly known as: SEROQUEL   Vitamin D 50 MCG (2000 UT) Caps     TAKE these medications   antiseptic oral rinse Liqd Apply 15 mLs topically as needed for dry mouth.   glycopyrrolate 0.2 MG/ML injection Commonly known as: ROBINUL Inject 1 mL (0.2 mg total) into the vein every 4 (four) hours as needed (excessive secretions).   haloperidol lactate 5 MG/ML injection Commonly known as: HALDOL Inject 0.1 mLs (0.5 mg total) into the vein every 4 (four) hours as needed (or delirium).   levETIRAcetam 1000 MG/100ML Soln Commonly known as: KEPPRA Inject 100 mLs (1,000 mg total) into the vein every 12 (twelve) hours.   LORazepam 2 MG/ML injection Commonly known as: ATIVAN Inject 0.5 mLs (1 mg total) into the vein every 4 (four) hours as needed for anxiety.   morphine 2 MG/ML injection Inject 1 mL (2 mg total) into the vein every hour as needed (pain or shortness of breath).     morphine CONCENTRATE 10 MG/0.5ML Soln concentrated solution Place 0.25 mLs (5 mg total) under the tongue every 2 (two) hours as needed for moderate pain (or dyspnea).   morphine CONCENTRATE 10 MG/0.5ML Soln concentrated solution Place 0.25 mLs (5 mg total) under the tongue every 6 (six) hours.   ondansetron 4 MG disintegrating tablet Commonly known as: ZOFRAN-ODT Take 1 tablet (4 mg total) by mouth every 6 (six) hours as needed for nausea.   ondansetron 4 MG/2ML Soln injection Commonly known as: ZOFRAN Inject 2 mLs (4 mg total) into the vein every 6 (six) hours as needed for nausea.   polyvinyl alcohol 1.4 % ophthalmic solution Commonly known as: LIQUIFILM TEARS Place 1 drop into both eyes 4 (four) times daily as needed for dry eyes.       Disposition and follow-up:   Ms.Gina Hampton was discharged from [facility] in stable but serious condition.  At the hospital follow up visit please address:  1.  Please continue patient on palliative end-of-life orders including morphine for air hunger and glycopyrrolate for secretions. Please consider continuing patient on IV keppra for seizure prevention.  2.  Labs / imaging needed at time of follow-up: None  3.  Pending labs/ test needing follow-up: None  Follow-up Appointments:   Hospital Course by problem list:  # Acute left MCA stroke Patient is an 84 year old female with past medical history significant for seizures, hypertension, and dementia who presented to the emergency department  on 04/25/2020 after being found unresponsive by facility staff.  CT head without contrast revealed a large left MCA stroke.  Patient's diagnosis and prognosis discussed with patient's sister/medical power of attorney by both internal medicine and neurology team.  Decision was made to pursue palliative, comfort care measures.  Further stroke work-up was not pursued.  Palliative care was consulted, provided recommendations for managing  end-of-life care.  All p.o. medications were held due to goals of care and inability to take p.o. meds.  Patient was discharged to home hospice.  # Seizure disorder Patient has a chronic history of seizures, since age of 6.  Outpatient medications of carbamazepine and levetiracetam.  There is no IV equivalent of p.o. carbamazepine.  Patient was placed on IV equivalent of levetiracetam (levetiracetam 1000 mg every 12 hours). This antiepileptic medication was continued consistent with goal for comfort.  Discharge Vitals:   BP (!) 125/102 (BP Location: Left Arm)   Pulse (!) 110   Temp 97.9 F (36.6 C) (Oral)   Resp 18   Ht 5\' 2"  (1.575 m)   Wt 63.5 kg   SpO2 97%   BMI 25.60 kg/m   Pertinent Labs, Studies, and Procedures:  CBC Latest Ref Rng & Units 04/25/2020 04/25/2020 01/24/2017  WBC 4.0 - 10.5 K/uL 9.7 - 3.3(L)  Hemoglobin 12.0 - 15.0 g/dL 12.8 12.9 10.8(L)  Hematocrit 36.0 - 46.0 % 37.1 38.0 30.9(L)  Platelets 150 - 400 K/uL 198 - 117(L)   CMP Latest Ref Rng & Units 04/25/2020 04/25/2020 04/25/2020  Glucose 70 - 99 mg/dL 122(H) 123(H) 127(H)  BUN 8 - 23 mg/dL 13 19 14   Creatinine 0.44 - 1.00 mg/dL 0.71 0.60 0.81  Sodium 135 - 145 mmol/L 137 136 137  Potassium 3.5 - 5.1 mmol/L 4.7 5.2(H) 4.1  Chloride 98 - 111 mmol/L 98 103 99  CO2 22 - 32 mmol/L 27 - 25  Calcium 8.9 - 10.3 mg/dL 9.6 - 9.4  Total Protein 6.5 - 8.1 g/dL 6.6 - 7.0  Total Bilirubin 0.3 - 1.2 mg/dL 1.2 - 0.7  Alkaline Phos 38 - 126 U/L 93 - 92  AST 15 - 41 U/L 53(H) - 36  ALT 0 - 44 U/L 18 - 17   CT Head WO Contrast (04/25/20): IMPRESSION: 1. Large acute infarct left MCA territory.  Negative for hemorrhage. 2. Hyperdense left MCA 3. ASPECTS is 4 4. These results were called by telephone at the time of interpretation on 04/25/2020 at 1:21 pm to provider [facility] , who verbally acknowledged these results.  Discharge Instructions: Discharge Instructions    Diet - low sodium heart healthy   Complete by: As  directed    Increase activity slowly   Complete by: As directed      Signed: Jeanmarie Hubert, MD 04/27/2020, 1:11 PM

## 2020-04-27 NOTE — ED Provider Notes (Signed)
[name] PROGRESSIVE CARE Provider Note   CSN: [id number] Arrival date & time: 04/25/20  1308     History Chief Complaint  Patient presents with  . Code Stroke    Gina Hampton is a 84 y.o. female.  HPI   Patient presents to the emergency department as a code stroke.  The patient was reported by staff to be seen this morning in her normal state.  But was found to be altered shortly thereafter.  The patient is a DNR and she is unable to give any history due to her severe stroke that has occurred.  The patient has history of dementia and seizures. Past Medical History:  Diagnosis Date  . Hypertension   . Seizures [facility])     Patient Active Problem List   Diagnosis Date Noted  . Dysphagia   . Dementia with behavioral disturbance (HCC)   . Do not resuscitate   . Advanced care planning/counseling discussion   . Goals of care, counseling/discussion   . Palliative care by specialist   . Acute ischemic left MCA stroke (HCC) 04/25/2020  . Influenza A 01/23/2017    Past Surgical History:  Procedure Laterality Date  . NO PAST SURGERIES       OB History   No obstetric history on file.     Family History  Problem Relation Age of Onset  . Cancer Mother     Social History   Tobacco Use  . Smoking status: Never Smoker  . Smokeless tobacco: Never Used  Substance Use Topics  . Alcohol use: No  . Drug use: No    Home Medications Prior to Admission medications   Medication Sig Start Date End Date Taking? Authorizing Provider  atorvastatin (LIPITOR) 40 MG tablet Take 40 mg by mouth at bedtime. 02/18/20  Yes [provider]  carbamazepine (TEGRETOL) 200 MG tablet Take 200-300 mg by mouth See admin instructions. Takes 300 mg by mouth in the morning and 200 mg in the evening.   Yes [provider]  Cholecalciferol (VITAMIN D) 50 MCG (2000 UT) CAPS Take 2,000 Units by mouth daily.   Yes [provider]  citalopram (CELEXA) 20 MG tablet Take  20 mg by mouth daily.   Yes [provider]  cloNIDine (CATAPRES) 0.1 MG tablet Take 0.1 mg by mouth 2 (two) times daily.   Yes [provider]  donepezil (ARICEPT) 10 MG tablet Take 10 mg by mouth at bedtime.   Yes [provider]  gabapentin (NEURONTIN) 300 MG capsule Take 600 mg by mouth daily.    Yes [provider]  levETIRAcetam (KEPPRA) 1000 MG tablet Take 1,000 mg by mouth 2 (two) times daily. 03/29/20  Yes [provider]  lisinopril (PRINIVIL,ZESTRIL) 40 MG tablet Take 1 tablet (40 mg total) by mouth daily. 01/25/17  Yes Mody, Patricia Pesa, MD  memantine (NAMENDA) 5 MG tablet Take 5 mg by mouth daily.  04/08/20  Yes [provider]  QUEtiapine (SEROQUEL) 25 MG tablet Take 25 mg by mouth 2 (two) times daily.  04/19/20  Yes [provider]  ipratropium-albuterol (DUONEB) 0.5-2.5 (3) MG/3ML SOLN Take 3 mLs by nebulization every 6 (six) hours as needed. Patient not taking: Reported on 04/25/2020 01/25/17   Adrian Saran, MD    Allergies    Patient has no known allergies.  Review of Systems   Review of Systems Level 5 caveat applies due to severe illness Physical Exam Updated Vital Signs BP (!) 155/108 (BP Location:  Left Arm)   Pulse 78   Temp 98.8 F (37.1 C)   Resp 16   Ht 5\' 2"  (1.575 m)   Wt 63.5 kg   SpO2 98%   BMI 25.60 kg/m   Physical Exam Vitals and nursing note reviewed.  Constitutional:      General: She is not in acute distress.    Appearance: She is well-developed.  HENT:     Head: Normocephalic and atraumatic.  Eyes:     Pupils: Pupils are equal, round, and reactive to light.  Cardiovascular:     Rate and Rhythm: Normal rate and regular rhythm.     Heart sounds: Normal heart sounds. No murmur. No friction rub. No gallop.   Pulmonary:     Effort: Pulmonary effort is normal. No respiratory distress.     Breath sounds: Normal breath sounds. No wheezing.  Abdominal:     General: Bowel sounds are normal. There  is no distension.     Palpations: Abdomen is soft.     Tenderness: There is no abdominal tenderness.  Musculoskeletal:     Cervical back: Normal range of motion and neck supple.  Skin:    General: Skin is warm and dry.     Capillary Refill: Capillary refill takes less than 2 seconds.     Findings: No erythema or rash.  Neurological:     Mental Status: She is alert.     Motor: No abnormal muscle tone.     Coordination: Coordination normal.     Comments: The patient is not responsive.  She has a left lateral gaze.  She is not able to follow commands.  Psychiatric:        Behavior: Behavior normal.     ED Results / Procedures / Treatments   Labs (all labs ordered are listed, but only abnormal results are displayed) Labs Reviewed  DIFFERENTIAL - Abnormal; Notable for the following components:      Result Value   Neutro Abs 8.2 (*)    All other components within normal limits  COMPREHENSIVE METABOLIC PANEL - Abnormal; Notable for the following components:   Glucose, Bld 122 (*)    AST 53 (*)    All other components within normal limits  COMPREHENSIVE METABOLIC PANEL - Abnormal; Notable for the following components:   Glucose, Bld 127 (*)    All other components within normal limits  GLUCOSE, CAPILLARY - Abnormal; Notable for the following components:   Glucose-Capillary 122 (*)    All other components within normal limits  GLUCOSE, CAPILLARY - Abnormal; Notable for the following components:   Glucose-Capillary 124 (*)    All other components within normal limits  I-STAT CHEM 8, ED - Abnormal; Notable for the following components:   Potassium 5.2 (*)    Glucose, Bld 123 (*)    Calcium, Ion 0.98 (*)    All other components within normal limits  CBG MONITORING, ED - Abnormal; Notable for the following components:   Glucose-Capillary 114 (*)    All other components within normal limits  PROTIME-INR  APTT  CBC    EKG EKG Interpretation  Date/Time:  Monday April 25 2020  13:31:28 EDT Ventricular Rate:  95 PR Interval:    QRS Duration: 96 QT Interval:  363 QTC Calculation: 457 R Axis:   32 Text Interpretation: Sinus rhythm Probable anteroseptal infarct, old No significant change since last tracing Confirmed by Blanchie Dessert [phone]) on 04/25/2020 2:15:33 PM   Radiology CT HEAD CODE STROKE WO  CONTRAST  Result Date: 04/25/2020 CLINICAL DATA:  Code stroke.  Right-sided weakness. EXAM: CT HEAD WITHOUT CONTRAST TECHNIQUE: Contiguous axial images were obtained from the base of the skull through the vertex without intravenous contrast. COMPARISON:  CT head 01/01/2017 FINDINGS: Brain: Large area of hypodensity in the left MCA territory compatible with acute infarct. No associated hemorrhage. There is low-density in the insular cortex. There appears to be sparing of the putamen and internal capsule. Hypodensity is present in the left frontal and parietal lobe. Generalized atrophy without hydrocephalus. Chronic infarct right basal ganglia and right thalamus. Vascular: Hyperdense left MCA compatible with acute thrombus. Skull: Negative Sinuses/Orbits: Mild mucosal edema paranasal sinuses. Bilateral cataract extraction. Other: None ASPECTS ([place] Stroke Program Early CT Score) - Ganglionic level infarction (caudate, lentiform nuclei, internal capsule, insula, M1-M3 cortex): 4 - Supraganglionic infarction (M4-M6 cortex): 0 Total score (0-10 with 10 being normal): 4 IMPRESSION: 1. Large acute infarct left MCA territory.  Negative for hemorrhage. 2. Hyperdense left MCA 3. ASPECTS is 4 4. These results were called by telephone at the time of interpretation on 04/25/2020 at 1:21 pm to provider [facility] , who verbally acknowledged these results. Electronically Signed   By: Marlan Palau M.D.   On: 04/25/2020 13:23    Procedures Procedures (including critical care time)  Medications Ordered in ED Medications  sodium chloride flush (NS) 0.9 % injection 3 mL (3 mLs  Intravenous Not Given 04/25/20 1334)  acetaminophen (TYLENOL) tablet 650 mg (has no administration in time range)    Or  acetaminophen (TYLENOL) suppository 650 mg (has no administration in time range)  ipratropium-albuterol (DUONEB) 0.5-2.5 (3) MG/3ML nebulizer solution 3 mL (has no administration in time range)  levETIRAcetam (KEPPRA) IVPB 1000 mg/100 mL premix (1,000 mg Intravenous New Bag/Given 04/26/20 2105)  haloperidol lactate (HALDOL) injection 0.5 mg (has no administration in time range)  ondansetron (ZOFRAN-ODT) disintegrating tablet 4 mg (has no administration in time range)    Or  ondansetron (ZOFRAN) injection 4 mg (has no administration in time range)  glycopyrrolate (ROBINUL) injection 0.2 mg (has no administration in time range)  antiseptic oral rinse (BIOTENE) solution 15 mL (has no administration in time range)  polyvinyl alcohol (LIQUIFILM TEARS) 1.4 % ophthalmic solution 1 drop (has no administration in time range)  morphine CONCENTRATE 10 MG/0.5ML oral solution 5 mg (has no administration in time range)  LORazepam (ATIVAN) injection 1 mg (has no administration in time range)  morphine CONCENTRATE 10 MG/0.5ML oral solution 5 mg (5 mg Sublingual Given 04/26/20 2332)  morphine 2 MG/ML injection 2 mg (2 mg Intravenous Given 04/26/20 1623)    ED Course  I have reviewed the triage vital signs and the nursing notes.  Pertinent labs & imaging results that were available during my care of the patient were reviewed by me and considered in my medical decision making (see chart for details).    MDM Rules/Calculators/A&P                      Patient has had a very large left MCA stroke.  The patient will be admitted to the hospital by the [facility] hospitalist service.  Neurology has also seen the patient as she has a code stroke.  Most likely will need palliative care consultation. Final Clinical Impression(s) / ED Diagnoses Final diagnoses:  Cerebral infarction, unspecified mechanism  (HCC)    Rx / DC Orders ED Discharge Orders    None       Kolby Schara,  Cristal Deer, PA-C 04/27/20 0008    Gwyneth Sprout, MD 04/28/20 2200

## 2020-04-27 NOTE — TOC Initial Note (Signed)
Transition of Care [facility]) - Initial/Assessment Note    Patient Details  Name: Gina Hampton MRN: [id number] Date of Birth: February 14, 1936  Transition of Care [facility] CM/SW Contact:    Terrilee Croak, Student-Social Work Phone Number: 04/27/2020, 2:23 PM  Clinical Narrative:                 SW TOC consulted for hospice. Referal sent to hospice of [facility]. They confirmed a spot at [facility]. Waiting on Covid results and Hospice is reaching out to patient's family. SW will follow for discharge.  Expected Discharge Plan: Hospice Medical Facility Barriers to Discharge: Other (comment)(Pending Covid results.)   Patient Goals and CMS Choice Patient states their goals for this hospitalization and ongoing recovery are:: Pt unable to participate in goal setting due to disorientation. CMS Medicare.gov Compare Post Acute Care list provided to:: Patient Represenative (must comment) Choice offered to / list presented to : Adult Children  Expected Discharge Plan and Services Expected Discharge Plan: Hospice Medical Facility In-house Referral: Hospice / Palliative Care     Living arrangements for the past 2 months: Assisted Living Facility([facility]) Expected Discharge Date: 04/27/20                                    Prior Living Arrangements/Services Living arrangements for the past 2 months: Assisted Living Facility([facility]) Lives with:: Facility Resident Patient language and need for interpreter reviewed:: Yes Do you feel safe going back to the place where you live?: Yes      Need for Family Participation in Patient Care: Yes (Comment) Care giver support system in place?: Yes (comment)   Criminal Activity/Legal Involvement Pertinent to Current Situation/Hospitalization: No - Comment as needed  Activities of Daily Living Home Assistive Devices/Equipment: Walker (specify type) ADL Screening (condition at time of admission) Patient's cognitive  ability adequate to safely complete daily activities?: No Is the patient deaf or have difficulty hearing?: No Does the patient have difficulty seeing, even when wearing glasses/contacts?: No Does the patient have difficulty concentrating, remembering, or making decisions?: Yes Patient able to express need for assistance with ADLs?: No Does the patient have difficulty dressing or bathing?: Yes Independently performs ADLs?: No Communication: Dependent Is this a change from baseline?: Pre-admission baseline Dressing (OT): Dependent Is this a change from baseline?: Pre-admission baseline Grooming: Dependent Is this a change from baseline?: Pre-admission baseline Feeding: Dependent Is this a change from baseline?: Pre-admission baseline Bathing: Dependent Is this a change from baseline?: Pre-admission baseline Toileting: Dependent Is this a change from baseline?: Pre-admission baseline In/Out Bed: Dependent Is this a change from baseline?: Pre-admission baseline Walks in Home: Dependent Is this a change from baseline?: Pre-admission baseline Does the patient have difficulty walking or climbing stairs?: Yes Weakness of Legs: Both Weakness of Arms/Hands: Both  Permission Sought/Granted Permission sought to share information with : Facility Medical sales representative    Share Information with NAME: Marigene Ehlers     Permission granted to share info w Relationship: Daughter  Permission granted to share info w Contact Information: [phone]  Emotional Assessment Appearance:: Other (Comment Required(Unable to Assess) Attitude/Demeanor/Rapport: Unable to Assess Affect (typically observed): Unable to Assess Orientation: : (Unable to Assess) Alcohol / Substance Use: Not Applicable Psych Involvement: No (comment)  Admission diagnosis:  Acute ischemic left MCA stroke (HCC) [I63.512] Cerebral infarction, unspecified mechanism [facility]) [I63.9] Patient Active Problem List   Diagnosis  Date  Noted  . Dysphagia   . Dementia with behavioral disturbance ([place])   . Do not resuscitate   . Advanced care planning/counseling discussion   . Goals of care, counseling/discussion   . Palliative care by specialist   . Acute ischemic left MCA stroke ([place]) 04/25/2020  . Influenza A 01/23/2017   PCP:  Adin Hector, MD Pharmacy:  No Pharmacies Listed    Social Determinants of Health (SDOH) Interventions    Readmission Risk Interventions No flowsheet data found.

## 2020-05-31 DEATH — deceased
